# Patient Record
Sex: Male | Born: 1955 | Race: Black or African American | Hispanic: No | Marital: Married | Smoking: Current every day smoker
Health system: Southern US, Community
[De-identification: ages and names within clinical notes are randomized; demographics above are authoritative.]

## PROBLEM LIST (undated history)

## (undated) DIAGNOSIS — I10 Essential (primary) hypertension: Secondary | ICD-10-CM

## (undated) DIAGNOSIS — N1832 Chronic kidney disease, stage 3b: Secondary | ICD-10-CM

## (undated) DIAGNOSIS — N139 Obstructive and reflux uropathy, unspecified: Secondary | ICD-10-CM

## (undated) DIAGNOSIS — E119 Type 2 diabetes mellitus without complications: Secondary | ICD-10-CM

## (undated) HISTORY — DX: Obstructive and reflux uropathy, unspecified: N13.9

## (undated) HISTORY — DX: Type 2 diabetes mellitus without complications: E11.9

## (undated) HISTORY — DX: Chronic kidney disease, stage 3b: N18.32

---

## 2001-05-07 ENCOUNTER — Emergency Department (HOSPITAL_COMMUNITY): Admission: EM | Admit: 2001-05-07 | Discharge: 2001-05-08 | Payer: Self-pay | Admitting: Emergency Medicine

## 2001-12-03 ENCOUNTER — Emergency Department (HOSPITAL_COMMUNITY): Admission: EM | Admit: 2001-12-03 | Discharge: 2001-12-04 | Payer: Self-pay | Admitting: Emergency Medicine

## 2001-12-04 ENCOUNTER — Encounter: Payer: Self-pay | Admitting: Emergency Medicine

## 2002-06-07 ENCOUNTER — Encounter: Payer: Self-pay | Admitting: Emergency Medicine

## 2002-06-07 ENCOUNTER — Emergency Department (HOSPITAL_COMMUNITY): Admission: EM | Admit: 2002-06-07 | Discharge: 2002-06-07 | Payer: Self-pay | Admitting: Emergency Medicine

## 2010-04-01 ENCOUNTER — Emergency Department (HOSPITAL_COMMUNITY): Admission: EM | Admit: 2010-04-01 | Discharge: 2010-04-01 | Payer: Self-pay | Admitting: Emergency Medicine

## 2010-09-14 LAB — URINALYSIS, ROUTINE W REFLEX MICROSCOPIC
Bilirubin Urine: NEGATIVE
Glucose, UA: NEGATIVE mg/dL
Hgb urine dipstick: NEGATIVE
Ketones, ur: NEGATIVE mg/dL
Nitrite: NEGATIVE
Protein, ur: NEGATIVE mg/dL
Specific Gravity, Urine: 1.006 (ref 1.005–1.030)
Urobilinogen, UA: 0.2 mg/dL (ref 0.0–1.0)
pH: 6 (ref 5.0–8.0)

## 2010-09-14 LAB — COMPREHENSIVE METABOLIC PANEL
BUN: 9 mg/dL (ref 6–23)
CO2: 31 mEq/L (ref 19–32)
Calcium: 9.1 mg/dL (ref 8.4–10.5)
Creatinine, Ser: 1.41 mg/dL (ref 0.4–1.5)
GFR calc non Af Amer: 52 mL/min — ABNORMAL LOW (ref >60)
Glucose, Bld: 81 mg/dL (ref 70–99)
Sodium: 141 mEq/L (ref 135–145)
Total Protein: 7.7 g/dL (ref 6.0–8.3)

## 2010-09-14 LAB — DIFFERENTIAL
Lymphocytes Relative: 26 % (ref 12–46)
Lymphs Abs: 1 10*3/uL (ref 0.7–4.0)
Monocytes Relative: 8 % (ref 3–12)
Neutro Abs: 2.5 10*3/uL (ref 1.7–7.7)
Neutrophils Relative %: 64 % (ref 43–77)

## 2010-09-14 LAB — CBC
HCT: 36.3 % — ABNORMAL LOW (ref 39.0–52.0)
Hemoglobin: 12.5 g/dL — ABNORMAL LOW (ref 13.0–17.0)
MCH: 33.1 pg (ref 26.0–34.0)
MCHC: 34.5 g/dL (ref 30.0–36.0)
MCV: 95.8 fL (ref 78.0–100.0)
Platelets: 288 10*3/uL (ref 150–400)
RBC: 3.79 MIL/uL — ABNORMAL LOW (ref 4.22–5.81)
RDW: 13.8 % (ref 11.5–15.5)
WBC: 3.9 10*3/uL — ABNORMAL LOW (ref 4.0–10.5)

## 2011-09-09 IMAGING — CR DG LUMBAR SPINE COMPLETE 4+V
5 series · 5 of 5 positions shown · non-contrast
Comparison: None.

CLINICAL DATA: Right-sided low back and leg pain.

LUMBAR SPINE - COMPLETE 4+ VIEW

[t l-spine a.p.]
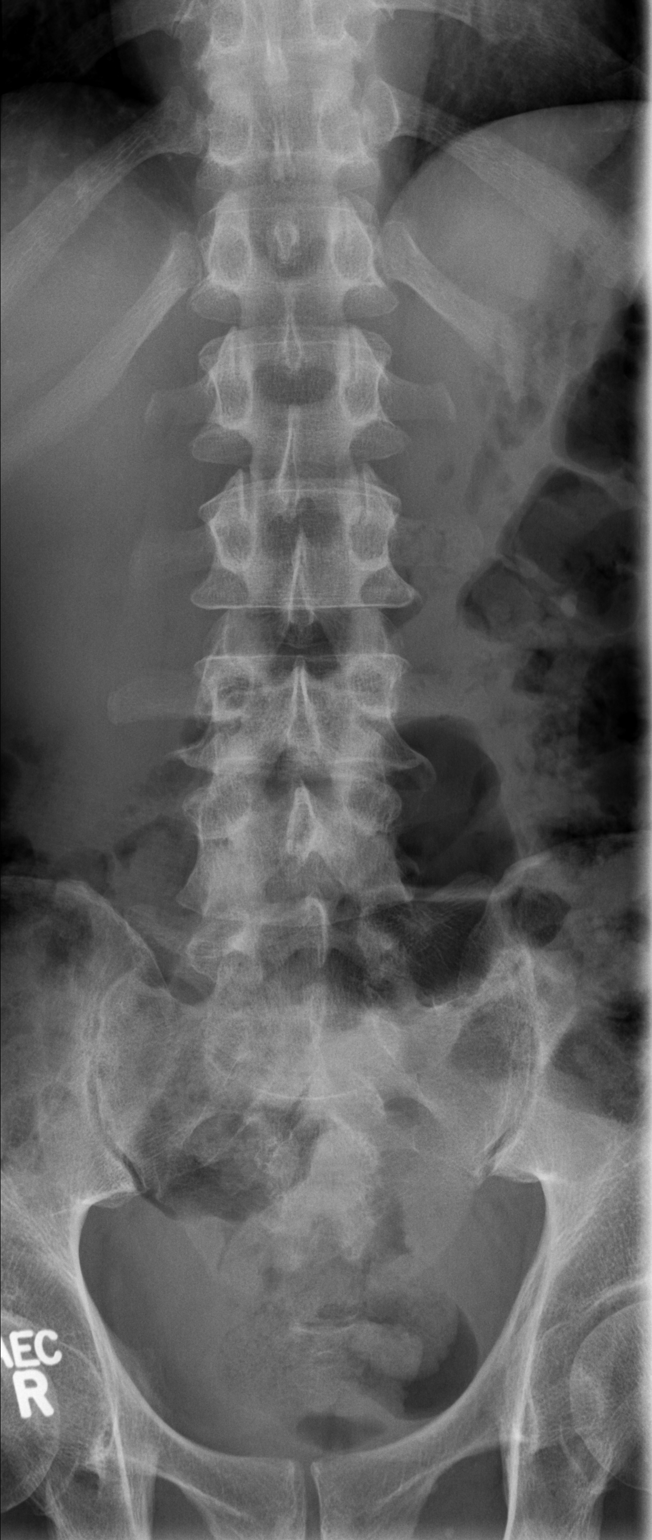

[t l-spine oblique exposure (1 of 2)]
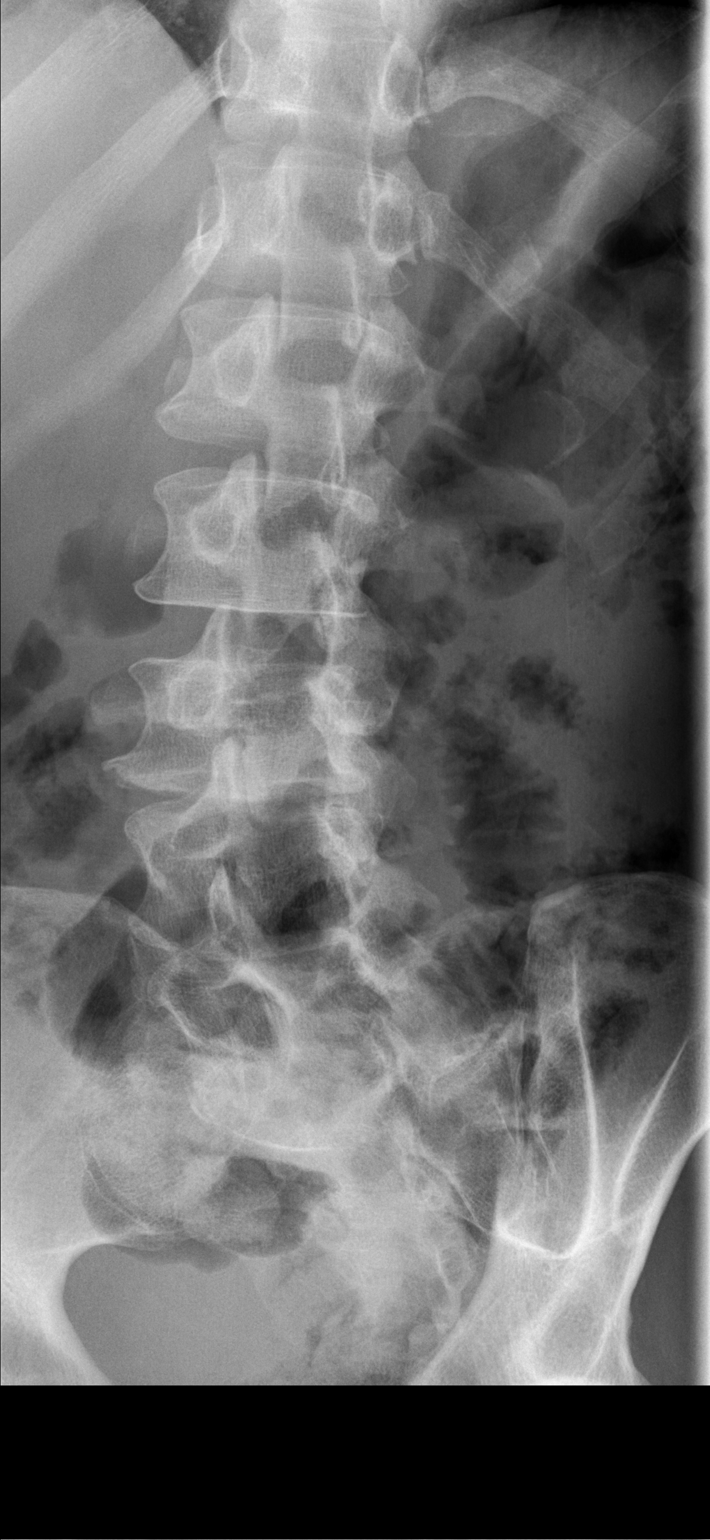

[t l-spine oblique exposure (2 of 2)]
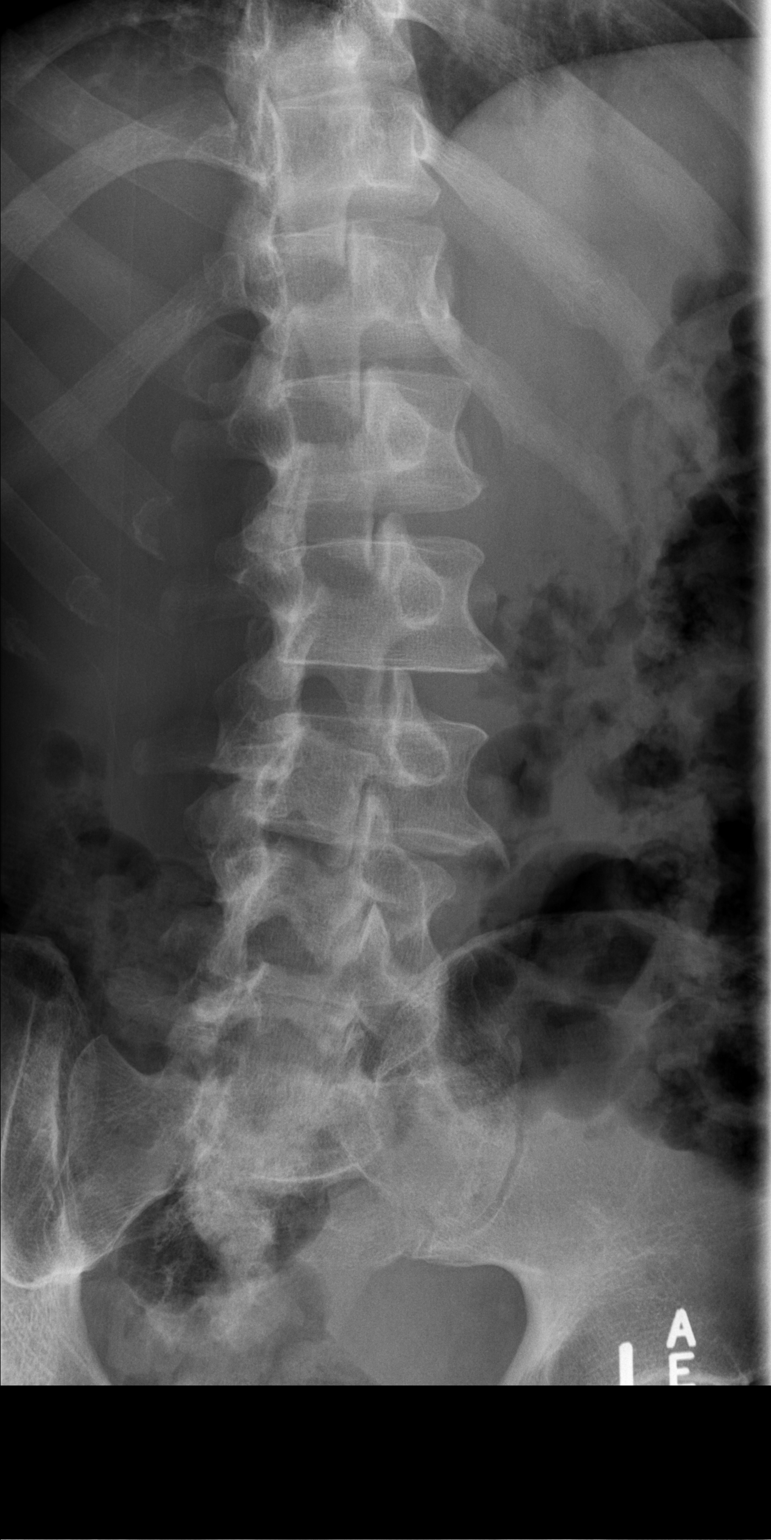

[t l-spine lat]
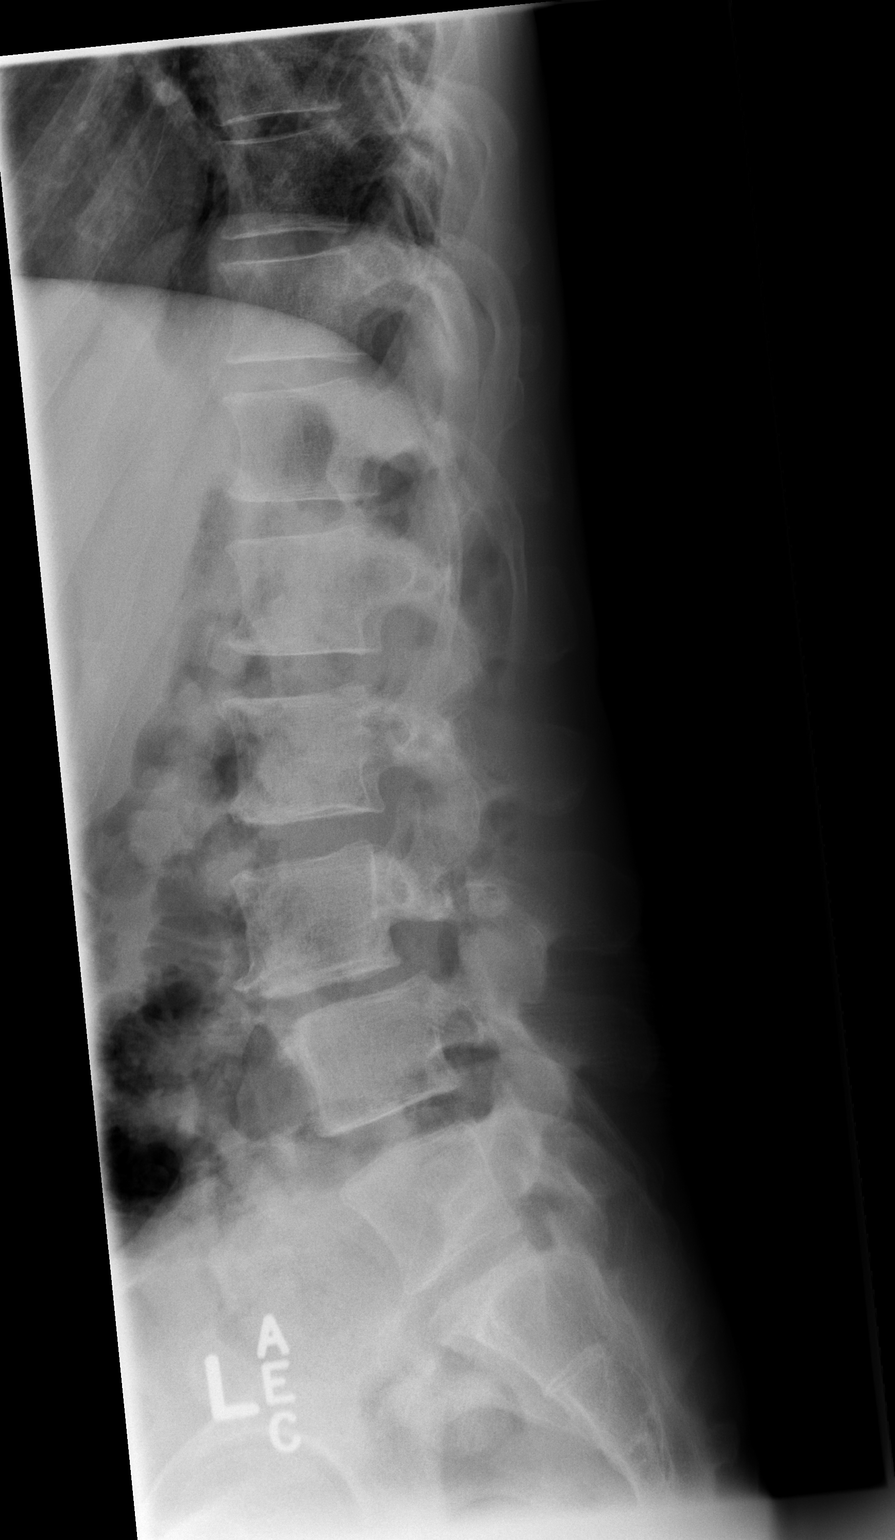

[t l-spine l5-s1 spot]
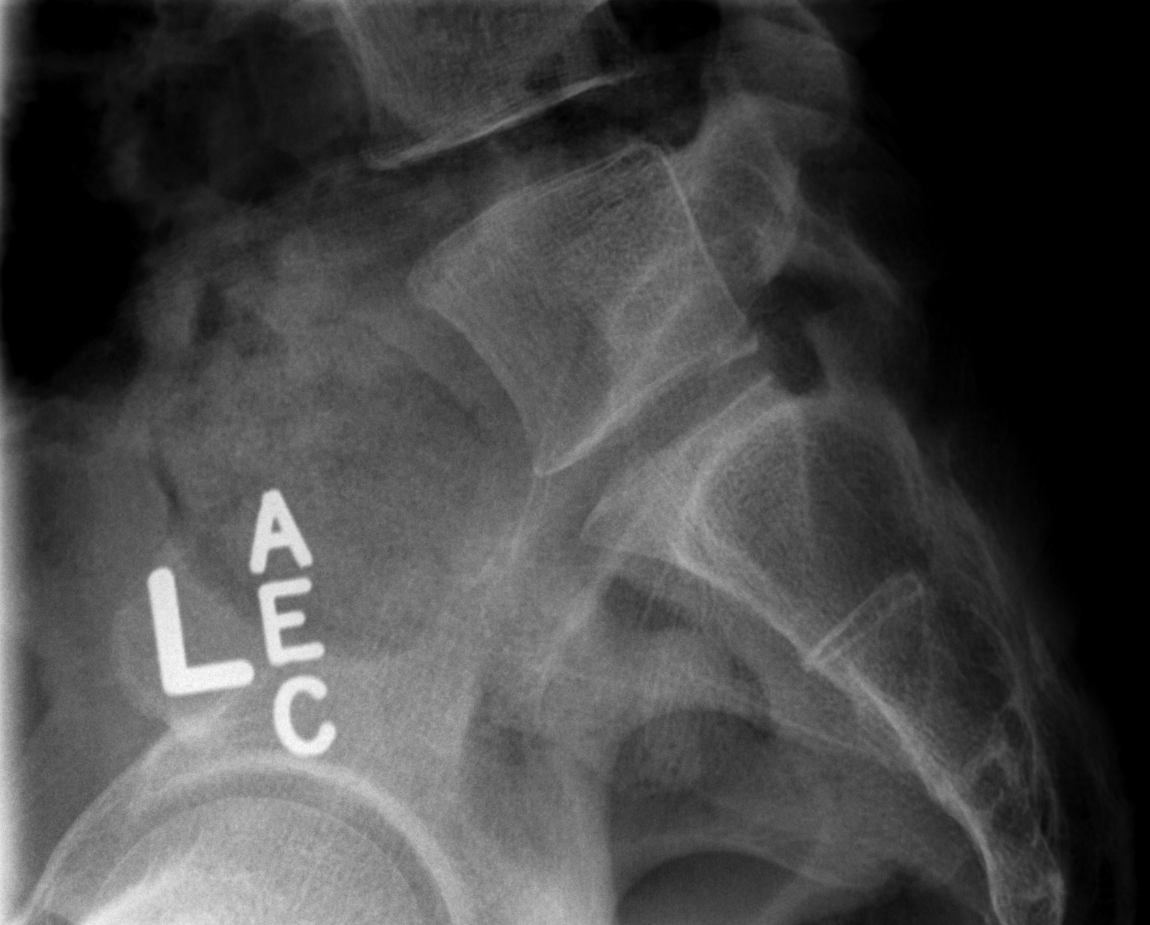

[5 of 5 positions shown; findings below may reference images not displayed]

FINDINGS: No evidence of acute lumbar spine fracture.

Bilateral spondylolysis seen at L3, with grade 1 anterolisthesis at
L3-4 measuring 8 mm.  Moderate L3-4 degenerative disc disease also
noted.  Other intervertebral disc spaces are maintained.
IMPRESSION: 1.  Bilateral L3 spondylolysis, with grade 1 anterolisthesis
measuring 8 mm.
2.  Moderate L3-4 degenerative disc disease.

## 2014-09-08 ENCOUNTER — Encounter (HOSPITAL_COMMUNITY): Payer: Self-pay | Admitting: Emergency Medicine

## 2014-09-08 ENCOUNTER — Emergency Department (INDEPENDENT_AMBULATORY_CARE_PROVIDER_SITE_OTHER)
Admission: EM | Admit: 2014-09-08 | Discharge: 2014-09-08 | Disposition: A | Discharge status: Home / Self Care | Payer: No Typology Code available for payment source | Source: Home / Self Care | Attending: Family Medicine | Admitting: Family Medicine

## 2014-09-08 DIAGNOSIS — S0083XA Contusion of other part of head, initial encounter: Secondary | ICD-10-CM

## 2014-09-08 DIAGNOSIS — S0993XA Unspecified injury of face, initial encounter: Secondary | ICD-10-CM

## 2014-09-08 DIAGNOSIS — H1132 Conjunctival hemorrhage, left eye: Secondary | ICD-10-CM

## 2014-09-08 MED ORDER — HYDROCODONE-ACETAMINOPHEN 5-325 MG PO TABS: 2.0000 | ORAL_TABLET | ORAL | Status: DC | PRN

## 2014-09-08 MED ORDER — AMOXICILLIN 500 MG PO CAPS: 500.0000 mg | ORAL_CAPSULE | Freq: Three times a day (TID) | ORAL | Status: DC

## 2014-09-08 NOTE — Discharge Instructions (Signed)
Contusion A contusion is a deep bruise. Contusions are the result of an injury that caused bleeding under the skin. The contusion may turn blue, purple, or yellow. Minor injuries will give you a painless contusion, but more severe contusions may stay painful and swollen for a few weeks.  CAUSES  A contusion is usually caused by a blow, trauma, or direct force to an area of the body. SYMPTOMS   Swelling and redness of the injured area.  Bruising of the injured area.  Tenderness and soreness of the injured area.  Pain. DIAGNOSIS  The diagnosis can be made by taking a history and physical exam. An X-ray, CT scan, or MRI may be needed to determine if there were any associated injuries, such as fractures. TREATMENT  Specific treatment will depend on what area of the body was injured. In general, the best treatment for a contusion is resting, icing, elevating, and applying cold compresses to the injured area. Over-the-counter medicines may also be recommended for pain control. Ask your caregiver what the best treatment is for your contusion. HOME CARE INSTRUCTIONS   Put ice on the injured area.  Put ice in a plastic bag.  Place a towel between your skin and the bag.  Leave the ice on for 15-20 minutes, 3-4 times a day, or as directed by your health care provider.  Only take over-the-counter or prescription medicines for pain, discomfort, or fever as directed by your caregiver. Your caregiver may recommend avoiding anti-inflammatory medicines (aspirin, ibuprofen, and naproxen) for 48 hours because these medicines may increase bruising.  Rest the injured area.  If possible, elevate the injured area to reduce swelling. SEEK IMMEDIATE MEDICAL CARE IF:   You have increased bruising or swelling.  You have pain that is getting worse.  Your swelling or pain is not relieved with medicines. MAKE SURE YOU:   Understand these instructions.  Will watch your condition.  Will get help right  away if you are not doing well or get worse. Document Released: 03/28/2005 Document Revised: 06/23/2013 Document Reviewed: 04/23/2011 [facility] Patient Information 2015 [place]Care, [place]. This information is not intended to replace advice given to you by your health care provider. Make sure you discuss any questions you have with your health care provider. Dental Injury Your exam shows that you have injured your teeth. The treatment of broken teeth and other dental injuries depends on how badly they are hurt. All dental injuries should be checked as soon as possible by a dentist if there are:  Loose teeth which may need to be wired or bonded with a plastic device to hold them in place.  Broken teeth with exposed tooth pulp which may cause a serious infection.  Painful teeth especially when you bite or chew.  Sharp tooth edges that cut your tongue or lips. Sometimes, antibiotics or pain medicine are prescribed to prevent infection and control pain. Eat a soft or liquid diet and rinse your mouth out after meals with warm water. You should see a dentist or return here at once if you have increased swelling, increased pain or uncontrolled bleeding from the site of your injury. SEEK MEDICAL CARE IF:   You have increased pain not controlled with medicines.  You have swelling around your tooth, in your face or neck.  You have bleeding which starts, continues, or gets worse.  You have a fever. Document Released: 06/18/2005 Document Revised: 09/10/2011 Document Reviewed: 06/17/2009 [facility] Patient Information 2015 [place]Care, [place]LLC. This information is not intended to  replace advice given to you by your health care provider. Make sure you discuss any questions you have with your health care provider.  Emergency Department Resource Guide 1) Find a Doctor and Pay Out of Pocket Although you won't have to find out who is covered by your insurance plan, it is a good idea to ask around and get  recommendations. You will then need to call the office and see if the doctor you have chosen will accept you as a new patient and what types of options they offer for patients who are self-pay. Some doctors offer discounts or will set up payment plans for their patients who do not have insurance, but you will need to ask so you aren't surprised when you get to your appointment.  2) Contact Your Local Health Department Not all health departments have doctors that can see patients for sick visits, but many do, so it is worth a call to see if yours does. If you don't know where your local health department is, you can check in your phone book. The CDC also has a tool to help you locate your state's health department, and many state websites also have listings of all of their local health departments.  3) Find a Walk-in Clinic If your illness is not likely to be very severe or complicated, you may want to try a walk in clinic. These are popping up all over the country in pharmacies, drugstores, and shopping centers. They're usually staffed by nurse practitioners or physician assistants that have been trained to treat common illnesses and complaints. They're usually fairly quick and inexpensive. However, if you have serious medical issues or chronic medical problems, these are probably not your best option.  No Primary Care Doctor: - Call Health Connect at  [phone] - they can help you locate a primary care doctor that  accepts your insurance, provides certain services, etc. - Physician Referral Service- [phone]  Chronic Pain Problems: Organization         Address  Phone   Notes  [facility]  [phone] Patients need to be referred by their primary care doctor.   Medication Assistance: Organization         Address  Phone   Notes  [facility] Medication [facility] [address] [place], [place] [address] [phone] --Must be a resident of  [facility] -- Must have NO insurance coverage whatsoever (no Medicaid/ Medicare, etc.) -- The pt. MUST have a primary care doctor that directs their care regularly and follows them in the community   [facility]ist  [phone]   [employer]  [phone]    Agencies that provide inexpensive medical care: Organization         Address  Phone   Notes  [facility]  [phone]   [facility]    [phone]   [facility] [address] [place], [place] [address] [phone]   [facility] [address] [place], [place] [phone]   Planned Parenthood    [phone]   [facility]    [phone]   [facility]  [address], [place] Phone:  [phone], Fax:  [phone] Hours of Operation:  9 am - 6 pm, M-F.  Also accepts Medicaid/Medicare and self-pay.  [facility]  [address], [place] Phone: [phone])  [phone], Fax: [phone]. Hours of Operation:  8:30 am - 5:30 pm, M-F.  Also accepts Medicaid and self-pay.  [facility] [place] [address], [place] Phone: [phone]) [phone]   [facility] [address], [place], [place] [phone], Ext. [phone] Mondays & Thursdays: 7-9 AM.  First 15 patients are seen on a first come, first serve basis.    Medicaid-accepting [facility] Providers:  Organization         Address  Phone   Notes  [facility] [address], [place] [phone] Also accepts self-pay patients.  [facility] [address], [place]  [phone]   [facility] [address], [place] [phone]   [facility] [address] [place] [address], [place] [phone]) [phone]   Renaye RakersVeita Bland [address], [place]   [phone] Only accepts [place] Access  [place]icaid patients after they have their name applied to their card.   Self-Pay (no insurance) in [facility]:  Organization         Address  Phone   Notes  Sickle Cell Patients, [facility] [address], [place] [phone]) [phone]   [facility] [address], [place] [phone]   [facility] [place]  [address], [place] [phone]   [facility]/Dr. Osei-Bonsu  [address] [place] [address], [place] or [phone], [place] [phone] Phone number for both [place] and [place] locations is the same.  [facility] [address], [place] [phone]   [facility]  [address] [place] [address], [place] or [address] [phone]) [phone]   [facility] [address], [place] [phone]) [phone], phone; [phone], fax Sees patients 1st and 3rd Saturday of every month.  Must not qualify for public or private insurance (i.e. Medicaid, Medicare, [place] Health Choice, Veterans' Benefits)  Household income should be no more than 200% of the poverty level The clinic cannot treat you if you are pregnant or think you are pregnant  Sexually transmitted diseases are not treated at the clinic.    Dental Care: Organization         Address  Phone  Notes  [facility] Department of [facility] [address], [place] [phone] Accepts children up to age 61 who are enrolled in [place]icaid or [place] Health Choice; pregnant women with a Medicaid card; and children who have applied for Medicaid or [place] Health Choice, but were declined, whose parents can pay a reduced fee at time of service.  [facility] of [facility] [place]  [address], [place] [phone]) [phone] Accepts children up to age 61 who are enrolled in [place]icaid or [place] Health Choice; pregnant women with a Medicaid  card; and children who have applied for Medicaid or [place] Health Choice, but were declined, whose parents can pay a reduced fee at time of service.  [facility]  [address], [place] [phone] Patients are seen by appointment only. Walk-ins are not accepted. [facility] will see patients 61 years of age and older. Monday - Tuesday (8am-5pm) Most Wednesdays (8:30-5pm) $30 per visit, cash only  [facility]  [address], [place] [phone] Patients are seen by appointment only. Walk-ins are not accepted. [facility] will see patients 85 years of age and older. One Wednesday Evening (Monthly: Volunteer Based).  $30 per visit, cash only  [employer]  [phone] for adults; Children under age 62, call Graduate Pediatric Dentistry at [phone]. Children aged 57-14, please call [phone] to request a pediatric application.  Dental services are provided in all areas of dental care including fillings, crowns and bridges, complete and partial dentures, implants, gum treatment, root canals, and extractions. Preventive care is also provided. Treatment is provided to both adults and children. Patients are selected via a lottery and there is often a waiting list.   [facility] [address], [place]  [phone] [email or web address]   [facility] [address] [place], [place] [phone], Ext. [phone] Second and Fourth Thursday of each month, opens at 6:30 AM; Clinic ends at 9 AM.  Patients are seen on a first-come first-served basis, and a limited number are seen during each clinic.   [facility]  [address] [place], [place] [phone]   Eligibility Requirements You must have lived in [place], [place], or [place] counties for at least the last three months.   You cannot be eligible for state or federal sponsored [place],  including [employer], [place], or [employer].   You generally cannot be eligible for healthcare insurance through your employer.    How to apply: Eligibility screenings are held every Tuesday and Wednesday afternoon from 1:00 pm until 4:00 pm. You do not need an appointment for the interview!  [facility] [address] [place], [place] [phone]   [facility] Health Department  [phone]   [facility] Health Department  [phone]   [facility] Health Department  [phone]    Behavioral Health Resources in the Community: Intensive Outpatient Programs Organization         Address  Phone  Notes  [facility] [address], [place], [place] [phone]   [facility] Outpatient [address], [place], [place] [phone]   [facility] [address] [place], [place]  [phone]   [facility] [address]  [place], [place] [phone] or [phone]   Substance Abuse Resources Organization         Address  Phone  Notes  [facility]  [phone]   [facility]  [phone]   [facility] [place]  [phone]   Floydene Flock  [phone]   Residential & Outpatient Substance Abuse Program  [phone]   Psychological Services Organization         Address  Phone  Notes  [facility]  [phone]   [facility] Services  [phone]   [facility] [address] [place] [phone] or [phone]    Mobile Crisis Teams Organization         Address  Phone  Notes  [facility], Mobile Crisis Care Unit  [phone]   [facility]  [address] [place], [place] [phone]   Doristine Locks [address] [place] [phone]    Self-Help/Support Groups Organization         Address  Phone             Notes  Mental Health Assoc.  of [place] - variety of support groups  [phone]- [phone] Call for more information  Narcotics Anonymous (NA), [facility] [address], [employer] [place]  2 meetings at this location   Residential Sports administrator         Address  Phone  Notes  [facility] [address],    [place]  [phone]   [facility]  [address] [place] [address], [place], [place] [phone]   [facility] [address] [place], [place] [phone] Admissions: 8am-3pm M-F  [facility] [address]    [place], [place] [phone]   [facility] [address] [place], [place], [place] [phone]   [facility] [address]  [place], [place] [phone]   [facility] - Intensive Outpatient [address], [place], [place] [phone]   [facility] ([facility]iction Recovery [facility] [address] [place]  [place], [place] [phone] or [phone]   [facility] ([facility]) [address] [place], [place] [phone] Accepts Medicaid  [facility] [address]  [place] [phone] Substance Abuse/Addiction Treatment   [facility] Organization         Address  Phone  Notes  [facility]  [phone]   Angie Fava, PhD [address] [place], [place]   [phone] or [phone]   [facility]   [address] [place], [place] [phone]   [facility] [address] [place] [address] [place], [place] [phone] Insurance/Medicaid/sponsorship through [facility] and Families [address]                                    [place], [place] [phone] Therapy/tele-psych/case  [facility] [address], [place] [phone]    Dr. Lolly Mustache  [phone]   [facility]  [employer] [facility] Dept. 1) [address], [place] 2)  [address] [place] 3)  [address], [place] [phone]  [phone]   [facility] Child Abuse Hotline (614)696-9098 or [phone] (After Hours)
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
## 2020-08-04 ENCOUNTER — Telehealth: Payer: Self-pay

## 2020-08-04 NOTE — Telephone Encounter (Signed)
NOTES ON FILE FROM [facility] [phone], SENT REFERRAL TO SCHEDULING 

## 2020-08-08 ENCOUNTER — Telehealth: Payer: Self-pay

## 2020-08-08 NOTE — Telephone Encounter (Signed)
NOTES ON FILE FROM [facility] [phone], SENT REFERRAL TO SCHEDULING 

## 2020-08-30 ENCOUNTER — Encounter: Payer: Self-pay | Admitting: General Practice

## 2021-12-29 ENCOUNTER — Emergency Department (HOSPITAL_COMMUNITY): Payer: Medicare Other

## 2021-12-29 ENCOUNTER — Emergency Department (HOSPITAL_COMMUNITY)
Admission: EM | Admit: 2021-12-29 | Discharge: 2021-12-29 | Disposition: A | Discharge status: Home / Self Care | Payer: Medicare Other | Attending: Student | Admitting: Student

## 2021-12-29 ENCOUNTER — Encounter (HOSPITAL_COMMUNITY): Payer: Self-pay

## 2021-12-29 DIAGNOSIS — I10 Essential (primary) hypertension: Secondary | ICD-10-CM | POA: Insufficient documentation

## 2021-12-29 DIAGNOSIS — R0789 Other chest pain: Secondary | ICD-10-CM | POA: Diagnosis not present

## 2021-12-29 DIAGNOSIS — R079 Chest pain, unspecified: Secondary | ICD-10-CM | POA: Diagnosis present

## 2021-12-29 DIAGNOSIS — F17210 Nicotine dependence, cigarettes, uncomplicated: Secondary | ICD-10-CM | POA: Insufficient documentation

## 2021-12-29 DIAGNOSIS — F1490 Cocaine use, unspecified, uncomplicated: Secondary | ICD-10-CM | POA: Diagnosis not present

## 2021-12-29 DIAGNOSIS — Z79899 Other long term (current) drug therapy: Secondary | ICD-10-CM | POA: Diagnosis not present

## 2021-12-29 LAB — TROPONIN I (HIGH SENSITIVITY)
Troponin I (High Sensitivity): 7 ng/L (ref <18)
Troponin I (High Sensitivity): 7 ng/L (ref <18)

## 2021-12-29 LAB — BASIC METABOLIC PANEL
Anion gap: 12 (ref 5–15)
BUN: 21 mg/dL (ref 8–23)
CO2: 23 mmol/L (ref 22–32)
Calcium: 9.2 mg/dL (ref 8.9–10.3)
Chloride: 99 mmol/L (ref 98–111)
Creatinine, Ser: 1.95 mg/dL — ABNORMAL HIGH (ref 0.61–1.24)
GFR, Estimated: 37 mL/min — ABNORMAL LOW (ref >60)
Glucose, Bld: 106 mg/dL — ABNORMAL HIGH (ref 70–99)
Potassium: 3.5 mmol/L (ref 3.5–5.1)
Sodium: 134 mmol/L — ABNORMAL LOW (ref 135–145)

## 2021-12-29 LAB — CBC
HCT: 37.4 % — ABNORMAL LOW (ref 39.0–52.0)
Hemoglobin: 12.5 g/dL — ABNORMAL LOW (ref 13.0–17.0)
MCH: 32.6 pg (ref 26.0–34.0)
MCHC: 33.4 g/dL (ref 30.0–36.0)
MCV: 97.4 fL (ref 80.0–100.0)
Platelets: 257 10*3/uL (ref 150–400)
RBC: 3.84 MIL/uL — ABNORMAL LOW (ref 4.22–5.81)
RDW: 12.6 % (ref 11.5–15.5)
WBC: 5.5 10*3/uL (ref 4.0–10.5)
nRBC: 0 % (ref 0.0–0.2)

## 2021-12-29 LAB — RAPID URINE DRUG SCREEN, HOSP PERFORMED
Amphetamines: NOT DETECTED
Barbiturates: NOT DETECTED
Benzodiazepines: NOT DETECTED
Cocaine: POSITIVE — AB
Opiates: NOT DETECTED
Tetrahydrocannabinol: NOT DETECTED

## 2021-12-29 LAB — ETHANOL: Alcohol, Ethyl (B): 44 mg/dL — ABNORMAL HIGH (ref <10)

## 2021-12-29 LAB — CBG MONITORING, ED: Glucose-Capillary: 73 mg/dL (ref 70–99)

## 2021-12-29 NOTE — ED Provider Triage Note (Signed)
Emergency Medicine Provider Triage Evaluation Note  William Bradshaw , a 66 y.o. male  was evaluated in triage.  Pt complains of chest pain came on while he was sitting down, pain is in his epigastric region does not radiate, no shortness of breath lightheaded dizziness become diaphoretic, states that happened after he did some cocaine, he also notes that he drinks some alcohol, no history of cardiac abnormalities history of PEs or DVTs currently on hormone therapy there is history of hypertension but no hyperlipidemia nondiabetic does smoke cigarettes.  Currently has no chest pain at this time..  Review of Systems  Positive: Epigastric tenderness, chest pain Negative: Shortness of breath diaphoretic  Physical Exam  BP (!) 170/106 (BP Location: Left Arm)   Pulse 81   Temp 98.5 F (36.9 C) (Oral)   Resp 15   SpO2 97%  Gen:   Awake, no distress   Resp:  Normal effort  MSK:   Moves extremities without difficulty  Other:    Medical Decision Making  Medically screening exam initiated at 2:04 AM.  Appropriate orders placed.  William Bradshaw was informed that the remainder of the evaluation will be completed by another provider, this initial triage assessment does not replace that evaluation, and the importance of remaining in the ED until their evaluation is complete.  Presents with epigastric/chest pain lab or imaging have been ordered will need further work-up.   Carroll Sage, PA-C 12/29/21 0206
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
## 2021-12-29 NOTE — ED Notes (Signed)
PT provided with refreshments

## 2021-12-29 NOTE — ED Provider Triage Note (Deleted)
Emergency Medicine Provider Triage Evaluation Note  Jakyrie E Goethe , a 66 y.o. male  was evaluated in triage.  Pt complains of shortness of breath and intoxication.  Patient states that he has been feeling short of breath for last few days, states that the surgery breath is intermittent, currently does not feel short of breath right now no associated chest pain does not become diaphoretic no lightheaded or dizziness, he states that he drank 4-5 beers today, denies any illicit drug use.  Review of Systems  Positive: Shortness of breath, intoxication Negative: Chest pain, abdominal pain  Physical Exam  BP (!) 170/106 (BP Location: Left Arm)   Pulse 81   Temp 98.5 F (36.9 C) (Oral)   Resp 15   SpO2 97%  Gen:   Awake, no distress   Resp:  Normal effort  MSK:   Moves extremities without difficulty  Other:    Medical Decision Making  Medically screening exam initiated at 1:52 AM.  Appropriate orders placed.  Kavir E Prisk was informed that the remainder of the evaluation will be completed by another provider, this initial triage assessment does not replace that evaluation, and the importance of remaining in the ED until their evaluation is complete.  Shortness of breath, lab work imaging been ordered will need further work-up.   Carroll Sage, PA-C 12/29/21 [phone]

## 2021-12-29 NOTE — Discharge Instructions (Signed)
Your work-up here was reassuring, please discontinue cocaine use as I suspect this is likely cause of your pain, I also recommend discontinuing alcohol use this is also contributed to your pain.  It is noted that your kidney functions are slightly elevated, please do not take any NSAIDs, do not use cocaine, and stay hydrated, please follow-up with your primary care doctor for reassessment.  Come back to the emergency department if you develop chest pain, shortness of breath, severe abdominal pain, uncontrolled nausea, vomiting, diarrhea.
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
## 2021-12-29 NOTE — ED Provider Notes (Signed)
[facility] EMERGENCY DEPARTMENT Provider Note   CSN: [id number] Arrival date & time: 12/29/21  0139     History  Chief Complaint  Patient presents with   Chest Pain    William Bradshaw is a 66 y.o. male.  HPI  Presents with chest pain came on while he was sitting down, pain is in his epigastric region does not radiate, no shortness of breath lightheaded dizziness become diaphoretic, states that happened after he did some cocaine, he also notes that he drinks some alcohol, no history of cardiac abnormalities history of PEs or DVTs currently on hormone therapy there is history of hypertension but no hyperlipidemia nondiabetic does smoke cigarettes.  Currently has no chest pain at this time..  Home Medications Prior to Admission medications   Medication Sig Start Date End Date Taking? Authorizing Provider  amoxicillin (AMOXIL) 500 MG capsule Take 1 capsule (500 mg total) by mouth 3 (three) times daily. 09/08/14   Elson Areas, PA-C  HYDROcodone-acetaminophen (NORCO/VICODIN) 5-325 MG per tablet Take 2 tablets by mouth every 4 (four) hours as needed. 09/08/14   Elson Areas, PA-C      Allergies    Patient has no known allergies.    Review of Systems   Review of Systems  Constitutional:  Negative for chills and fever.  Respiratory:  Negative for shortness of breath.   Cardiovascular:  Negative for chest pain.  Gastrointestinal:  Negative for abdominal pain.  Neurological:  Negative for headaches.    Physical Exam Updated Vital Signs BP (!) 187/125   Pulse 73   Temp 98.5 F (36.9 C) (Oral)   Resp 18   SpO2 100%  Physical Exam Vitals and nursing note reviewed.  Constitutional:      General: He is not in acute distress.    Appearance: He is not ill-appearing.  HENT:     Head: Normocephalic and atraumatic.     Nose: No congestion.  Eyes:     Conjunctiva/sclera: Conjunctivae normal.  Cardiovascular:     Rate and Rhythm: Normal rate and regular  rhythm.     Pulses: Normal pulses.     Heart sounds: No murmur heard.    No friction rub. No gallop.  Pulmonary:     Effort: No respiratory distress.     Breath sounds: No wheezing, rhonchi or rales.  Skin:    General: Skin is warm and dry.  Neurological:     Mental Status: He is alert.  Psychiatric:        Mood and Affect: Mood normal.     ED Results / Procedures / Treatments   Labs (all labs ordered are listed, but only abnormal results are displayed) Labs Reviewed  BASIC METABOLIC PANEL - Abnormal; Notable for the following components:      Result Value   Sodium 134 (*)    Glucose, Bld 106 (*)    Creatinine, Ser 1.95 (*)    GFR, Estimated 37 (*)    All other components within normal limits  CBC - Abnormal; Notable for the following components:   RBC 3.84 (*)    Hemoglobin 12.5 (*)    HCT 37.4 (*)    All other components within normal limits  ETHANOL - Abnormal; Notable for the following components:   Alcohol, Ethyl (B) 44 (*)    All other components within normal limits  RAPID URINE DRUG SCREEN, HOSP PERFORMED - Abnormal; Notable for the following components:   Cocaine POSITIVE (*)  All other components within normal limits  CBG MONITORING, ED  TROPONIN I (HIGH SENSITIVITY)  TROPONIN I (HIGH SENSITIVITY)    EKG None  Radiology DG Chest 2 View  Result Date: 12/29/2021 CLINICAL DATA:  Chest pain EXAM: CHEST - 2 VIEW COMPARISON:  None Available. FINDINGS: The heart size and mediastinal contours are within normal limits. Both lungs are clear. The visualized skeletal structures are unremarkable. IMPRESSION: No active cardiopulmonary disease. Electronically Signed   By: Helyn Numbers M.D.   On: 12/29/2021 02:39    Procedures Procedures    Medications Ordered in ED Medications - No data to display  ED Course/ Medical Decision Making/ A&P                           Medical Decision Making Amount and/or Complexity of Data Reviewed Labs: ordered. Radiology:  ordered.   This patient presents to the ED for concern of chest pain, this involves an extensive number of treatment options, and is a complaint that carries with it a high risk of complications and morbidity.  The differential diagnosis includes PE, ACS, pneumonia    Additional history obtained:  Additional history obtained from N/A External records from outside source obtained and reviewed including previous ED visits   Co morbidities that complicate the patient evaluation  Hypertension, polysubstance dependency  Social Determinants of Health:  N/a      Lab Tests:  I Ordered, and personally interpreted labs.  The pertinent results include: CBC shows no sick anemia hemoglobin 12.5, BMP shows sodium of 134 glucose 106 creatinine 1.95 GFR 37, rapid urine drug screen positive for cocaine, ethanol 44, negative delta troponin   Imaging Studies ordered:  I ordered imaging studies including chest x-ray I independently visualized and interpreted imaging which showed unremarkable I agree with the radiologist interpretation   Cardiac Monitoring:  The patient was maintained on a cardiac monitor.  I personally viewed and interpreted the cardiac monitored which showed an underlying rhythm of: EKG without signs of ischemia   Medicines ordered and prescription drug management:  I ordered medication including N/A I have reviewed the patients home medicines and have made adjustments as needed  Critical Interventions:  N/A    Reevaluation:  Presents with chest pain triage obtain lab work imaging which I personally reviewed does show creatinine 1.9 appears to be his baseline, chest x-ray is unremarkable, he is having no complaints, he is agreement discharge at this time.  Consultations Obtained:  N/A    Test Considered:  N/A     Rule out I have low suspicion for ACS as history is atypical, patient has no cardiac history, EKG was sinus rhythm without signs of  ischemia, patient had negative delta troponin.  Low suspicion for PE as patient denies pleuritic chest pain, shortness of breath, patient denies leg pain, no pedal edema noted on exam, patient was PERC negative.  Low suspicion for AAA or aortic dissection as history is atypical, patient has low risk factors.  Low suspicion for systemic infection as patient is nontoxic-appearing, vital signs reassuring, no obvious source infection noted on exam.     Dispostion and problem list  After consideration of the diagnostic results and the patients response to treatment, I feel that the patent would benefit from discharge.  Chest pain since resolved-suspect this is more GERD versus polysubstance use i.e. cocaine recommend that he discontinue cocaine use follow-up PCP for further evaluation Decrease GFR-inform patient kidney function, recommend  that he discontinued NSAID use, cocaine use, stay hydrated, follow-up with PCP for reassessment.            Final Clinical Impression(s) / ED Diagnoses Final diagnoses:  Atypical chest pain  Cocaine use    Rx / DC Orders ED Discharge Orders     None         Carroll Sage, PA-C 12/29/21 0536    Sabas Sous, MD 12/29/21 [phone]

## 2023-04-02 ENCOUNTER — Encounter: Payer: Self-pay | Admitting: Nurse Practitioner

## 2023-04-03 ENCOUNTER — Other Ambulatory Visit: Payer: Self-pay | Admitting: Nurse Practitioner

## 2023-04-03 DIAGNOSIS — I10 Essential (primary) hypertension: Secondary | ICD-10-CM

## 2023-04-11 ENCOUNTER — Ambulatory Visit
Admission: RE | Admit: 2023-04-11 | Discharge: 2023-04-11 | Disposition: A | Discharge status: Home / Self Care | Payer: Medicare HMO | Source: Ambulatory Visit | Attending: Nurse Practitioner | Admitting: Nurse Practitioner

## 2023-04-11 DIAGNOSIS — I10 Essential (primary) hypertension: Secondary | ICD-10-CM

## 2023-04-12 ENCOUNTER — Emergency Department (HOSPITAL_COMMUNITY): Payer: Medicare HMO

## 2023-04-12 ENCOUNTER — Encounter (HOSPITAL_COMMUNITY): Payer: Self-pay | Admitting: Emergency Medicine

## 2023-04-12 ENCOUNTER — Inpatient Hospital Stay (HOSPITAL_COMMUNITY)
Admission: EM | Admit: 2023-04-12 | Discharge: 2023-04-18 | DRG: 689 | Disposition: A | Discharge status: Home / Self Care | Payer: Medicare HMO | Attending: Internal Medicine | Admitting: Internal Medicine

## 2023-04-12 ENCOUNTER — Other Ambulatory Visit: Payer: Self-pay

## 2023-04-12 DIAGNOSIS — R3916 Straining to void: Secondary | ICD-10-CM | POA: Diagnosis present

## 2023-04-12 DIAGNOSIS — F17210 Nicotine dependence, cigarettes, uncomplicated: Secondary | ICD-10-CM | POA: Diagnosis present

## 2023-04-12 DIAGNOSIS — E875 Hyperkalemia: Secondary | ICD-10-CM | POA: Diagnosis present

## 2023-04-12 DIAGNOSIS — N179 Acute kidney failure, unspecified: Secondary | ICD-10-CM | POA: Diagnosis present

## 2023-04-12 DIAGNOSIS — N323 Diverticulum of bladder: Secondary | ICD-10-CM | POA: Diagnosis present

## 2023-04-12 DIAGNOSIS — F1410 Cocaine abuse, uncomplicated: Secondary | ICD-10-CM | POA: Diagnosis present

## 2023-04-12 DIAGNOSIS — I129 Hypertensive chronic kidney disease with stage 1 through stage 4 chronic kidney disease, or unspecified chronic kidney disease: Secondary | ICD-10-CM | POA: Diagnosis present

## 2023-04-12 DIAGNOSIS — D631 Anemia in chronic kidney disease: Secondary | ICD-10-CM | POA: Diagnosis present

## 2023-04-12 DIAGNOSIS — R339 Retention of urine, unspecified: Secondary | ICD-10-CM | POA: Diagnosis present

## 2023-04-12 DIAGNOSIS — G4700 Insomnia, unspecified: Secondary | ICD-10-CM | POA: Diagnosis present

## 2023-04-12 DIAGNOSIS — E43 Unspecified severe protein-calorie malnutrition: Secondary | ICD-10-CM | POA: Diagnosis present

## 2023-04-12 DIAGNOSIS — N3000 Acute cystitis without hematuria: Secondary | ICD-10-CM

## 2023-04-12 DIAGNOSIS — Z79899 Other long term (current) drug therapy: Secondary | ICD-10-CM | POA: Diagnosis not present

## 2023-04-12 DIAGNOSIS — R338 Other retention of urine: Secondary | ICD-10-CM | POA: Diagnosis present

## 2023-04-12 DIAGNOSIS — Z681 Body mass index (BMI) 19 or less, adult: Secondary | ICD-10-CM | POA: Diagnosis not present

## 2023-04-12 DIAGNOSIS — N3289 Other specified disorders of bladder: Secondary | ICD-10-CM | POA: Diagnosis present

## 2023-04-12 DIAGNOSIS — N136 Pyonephrosis: Principal | ICD-10-CM | POA: Diagnosis present

## 2023-04-12 DIAGNOSIS — N401 Enlarged prostate with lower urinary tract symptoms: Secondary | ICD-10-CM | POA: Diagnosis present

## 2023-04-12 DIAGNOSIS — N320 Bladder-neck obstruction: Secondary | ICD-10-CM | POA: Diagnosis present

## 2023-04-12 DIAGNOSIS — N1832 Chronic kidney disease, stage 3b: Secondary | ICD-10-CM | POA: Diagnosis present

## 2023-04-12 HISTORY — DX: Essential (primary) hypertension: I10

## 2023-04-12 LAB — HEPATIC FUNCTION PANEL
ALT: 15 U/L (ref 0–44)
AST: 17 U/L (ref 15–41)
Albumin: 3.2 g/dL — ABNORMAL LOW (ref 3.5–5.0)
Alkaline Phosphatase: 65 U/L (ref 38–126)
Bilirubin, Direct: <0.1 mg/dL (ref 0.0–0.2)
Total Bilirubin: 0.4 mg/dL (ref 0.3–1.2)
Total Protein: 8.2 g/dL — ABNORMAL HIGH (ref 6.5–8.1)

## 2023-04-12 LAB — URINALYSIS, ROUTINE W REFLEX MICROSCOPIC
Bilirubin Urine: NEGATIVE
Glucose, UA: NEGATIVE mg/dL
Ketones, ur: NEGATIVE mg/dL
Nitrite: NEGATIVE
Protein, ur: 100 mg/dL — AB
Specific Gravity, Urine: 1.01 (ref 1.005–1.030)
WBC, UA: >50 WBC/hpf (ref 0–5)
pH: 5 (ref 5.0–8.0)

## 2023-04-12 LAB — CBC
HCT: 31.7 % — ABNORMAL LOW (ref 39.0–52.0)
Hemoglobin: 10.1 g/dL — ABNORMAL LOW (ref 13.0–17.0)
MCH: 31.1 pg (ref 26.0–34.0)
MCHC: 31.9 g/dL (ref 30.0–36.0)
MCV: 97.5 fL (ref 80.0–100.0)
Platelets: 451 10*3/uL — ABNORMAL HIGH (ref 150–400)
RBC: 3.25 MIL/uL — ABNORMAL LOW (ref 4.22–5.81)
RDW: 12.6 % (ref 11.5–15.5)
WBC: 8.6 10*3/uL (ref 4.0–10.5)
nRBC: 0 % (ref 0.0–0.2)

## 2023-04-12 LAB — BASIC METABOLIC PANEL
Anion gap: 13 (ref 5–15)
BUN: 43 mg/dL — ABNORMAL HIGH (ref 8–23)
CO2: 17 mmol/L — ABNORMAL LOW (ref 22–32)
Calcium: 9.2 mg/dL (ref 8.9–10.3)
Chloride: 108 mmol/L (ref 98–111)
Creatinine, Ser: 3.78 mg/dL — ABNORMAL HIGH (ref 0.61–1.24)
GFR, Estimated: 17 mL/min — ABNORMAL LOW (ref >60)
Glucose, Bld: 98 mg/dL (ref 70–99)
Potassium: 5.6 mmol/L — ABNORMAL HIGH (ref 3.5–5.1)
Sodium: 138 mmol/L (ref 135–145)

## 2023-04-12 LAB — RAPID URINE DRUG SCREEN, HOSP PERFORMED
Amphetamines: NOT DETECTED
Barbiturates: NOT DETECTED
Benzodiazepines: NOT DETECTED
Cocaine: POSITIVE — AB
Opiates: NOT DETECTED
Tetrahydrocannabinol: NOT DETECTED

## 2023-04-12 LAB — I-STAT CG4 LACTIC ACID, ED
Lactic Acid, Venous: 1.3 mmol/L (ref 0.5–1.9)
Lactic Acid, Venous: 1.2 mmol/L (ref 0.5–1.9)

## 2023-04-12 MED ORDER — POLYETHYLENE GLYCOL 3350 17 G PO PACK
17.0000 g | PACK | Freq: Every day | ORAL | Status: DC | PRN
  Administered 2023-04-15: 17 g via ORAL
  Filled 2023-04-12: qty 1

## 2023-04-12 MED ORDER — SODIUM CHLORIDE 0.9 % IV SOLN
1.0000 g | Freq: Once | INTRAVENOUS | Status: AC
  Administered 2023-04-12: 1 g via INTRAVENOUS
  Filled 2023-04-12: qty 10

## 2023-04-12 MED ORDER — SODIUM CHLORIDE 0.9 % IV BOLUS
1000.0000 mL | Freq: Once | INTRAVENOUS | Status: AC
  Administered 2023-04-12: 1000 mL via INTRAVENOUS

## 2023-04-12 MED ORDER — TAMSULOSIN HCL 0.4 MG PO CAPS
0.4000 mg | ORAL_CAPSULE | Freq: Every day | ORAL | Status: DC
  Administered 2023-04-12 – 2023-04-18 (×7): 0.4 mg via ORAL
  Filled 2023-04-12 (×7): qty 1

## 2023-04-12 MED ORDER — NIFEDIPINE ER OSMOTIC RELEASE 30 MG PO TB24
30.0000 mg | ORAL_TABLET | Freq: Every day | ORAL | Status: DC
  Administered 2023-04-13 – 2023-04-18 (×6): 30 mg via ORAL
  Filled 2023-04-12 (×6): qty 1

## 2023-04-12 MED ORDER — HEPARIN SODIUM (PORCINE) 5000 UNIT/ML IJ SOLN
5000.0000 [IU] | Freq: Three times a day (TID) | INTRAMUSCULAR | Status: DC
  Administered 2023-04-13 – 2023-04-18 (×14): 5000 [IU] via SUBCUTANEOUS
  Filled 2023-04-12 (×13): qty 1

## 2023-04-12 MED ORDER — SODIUM ZIRCONIUM CYCLOSILICATE 5 G PO PACK
5.0000 g | PACK | Freq: Once | ORAL | Status: AC
  Administered 2023-04-12: 5 g via ORAL
  Filled 2023-04-12: qty 1

## 2023-04-12 MED ORDER — ACETAMINOPHEN 650 MG RE SUPP: 650.0000 mg | Freq: Four times a day (QID) | RECTAL | Status: DC | PRN

## 2023-04-12 MED ORDER — OXYCODONE HCL 5 MG PO TABS
5.0000 mg | ORAL_TABLET | ORAL | Status: DC | PRN
  Administered 2023-04-15: 5 mg via ORAL
  Filled 2023-04-12: qty 1

## 2023-04-12 MED ORDER — ONDANSETRON HCL 4 MG PO TABS: 4.0000 mg | ORAL_TABLET | Freq: Four times a day (QID) | ORAL | Status: DC | PRN

## 2023-04-12 MED ORDER — OXYBUTYNIN CHLORIDE 5 MG PO TABS
5.0000 mg | ORAL_TABLET | Freq: Three times a day (TID) | ORAL | Status: DC | PRN
  Administered 2023-04-12 – 2023-04-16 (×7): 5 mg via ORAL
  Filled 2023-04-12 (×8): qty 1

## 2023-04-12 MED ORDER — ONDANSETRON HCL 4 MG/2ML IJ SOLN: 4.0000 mg | Freq: Four times a day (QID) | INTRAMUSCULAR | Status: DC | PRN

## 2023-04-12 MED ORDER — ACETAMINOPHEN 325 MG PO TABS
650.0000 mg | ORAL_TABLET | Freq: Four times a day (QID) | ORAL | Status: DC | PRN
  Administered 2023-04-17: 650 mg via ORAL
  Filled 2023-04-12: qty 2

## 2023-04-12 NOTE — H&P (Signed)
HISTORY AND PHYSICAL    MARCELLE KOMOROSKI   MVH:[id number] DOB: [birth year]   Date of Service: 04/12/23 Requesting physician/APP from ED: Treatment Team:  Attending Provider: Sunnie Nielsen, DO  PCP: Ellyn Hack, MD     HPI: William Bradshaw is a 67 y.o. male w/ PMH hypertension, presents to ED 04/12/2023 with chief complaint of dizziness, weakness, decreased appetite and abnormal labs from his PCP.  Reports difficulty emptying his bladder over the past several months, describing weak stream, straining to urinate, urgency/frequency.   In ED, UA concerning for UTI, urinary retention requiring Foley catheter placement by urology, mild hyperkalemia 5.6 without EKG changes, AKI on uncertain baseline kidney function with creatinine 3.78 and GFR 17, WBC normal at 8.6 and no elevation in lactate, mild anemia hemoglobin 10.1.  CT abdomen pelvis demonstrated moderate to severe right and left hydronephrosis, hydroureter, concern for bladder outlet obstruction and bladder wall thickening.  EDP consulted urology, Foley catheter was placed.  Hospitalist consulted for admission    Consultants:  Urology  Procedures: None      ASSESSMENT & PLAN:   Urinary retention likely due to BPH UTI, bladder distention and bladder diverticula, hydronephrosis, AKI likely sequela of urinary retention Difficult Foley placement UTI without sepsis Maintain Foley Urology to follow Continue ceftriaxone pending urine and blood cultures Closely monitor output, BMP.    Hyperkalemia Likely due to AKI Closely monitor BMP Hopefully will resolve with Foley placement/resolution of underlying cause  AKI likely due to urinary obstruction/UTI Creatinine in ED 3.78, most recent for comparison 1.951-year ago Closely monitor BMP Given IV fluid shortage we will not continue IV fluids for AKI as hopefully Foley placement will result in improvement with resolution of obstruction  Hypertension     DVT  prophylaxis: heparin given AKI Pertinent IV fluids/nutrition: s/p IV fluids in ED, will not continue at this time but low threshold to restart. Regular diet  Central lines / invasive devices: Foley catheter placed 04/12/23   Code Status: FULL CODE Family Communication: none at this time  Disposition: inpatient TOC needs: TBD Barriers to discharge / significant pending items: urine cultures and AKI/K improvement                   Review of Systems:  Review of Systems  Constitutional:  Positive for chills, malaise/fatigue and weight loss. Negative for diaphoresis and fever.  HENT:  Negative for congestion, sinus pain and sore throat.   Respiratory:  Negative for cough and shortness of breath.   Cardiovascular:  Negative for chest pain, palpitations and leg swelling.  Gastrointestinal:  Positive for nausea. Negative for blood in stool, constipation, heartburn and vomiting.  Genitourinary:  Positive for frequency and urgency. Negative for dysuria.  Musculoskeletal:  Negative for falls and myalgias.  Skin:  Negative for rash.  Neurological:  Positive for dizziness. Negative for focal weakness.  Psychiatric/Behavioral:  Negative for depression.        has a past medical history of Hypertension.  No current facility-administered medications on file prior to encounter.   Current Outpatient Medications on File Prior to Encounter  Medication Sig Dispense Refill   NIFEdipine (PROCARDIA-XL/NIFEDICAL-XL) 30 MG 24 hr tablet Take 30 mg by mouth daily.     amLODipine (NORVASC) 2.5 MG tablet Take 2.5 mg by mouth daily. (Patient not taking: Reported on 04/12/2023)     No Known Allergies  family history is not on file.  No past surgical history on file.  Objective Findings:  Vitals:   04/12/23 1753 04/12/23 1815 04/12/23 2215 04/12/23 2231  BP:  (!) 139/91 (!) 144/86   Pulse:  64 (!) 59   Resp:  (!) 25 19   Temp: 98.5 F (36.9 C)   98.4 F (36.9 C)   TempSrc: Oral   Oral  SpO2:  100% (!) 61%   Weight:      Height:       No intake or output data in the 24 hours ending 04/12/23 2327 Filed Weights   04/12/23 1407  Weight: 53.1 kg    Examination:  Physical Exam Constitutional:      Appearance: Normal appearance.  HENT:     Head: Normocephalic and atraumatic.  Eyes:     Extraocular Movements: Extraocular movements intact.  Cardiovascular:     Rate and Rhythm: Normal rate and regular rhythm.  Pulmonary:     Effort: Pulmonary effort is normal.     Breath sounds: Normal breath sounds.  Abdominal:     General: Abdomen is flat.     Palpations: Abdomen is soft.     Tenderness: There is abdominal tenderness (suprapubic).  Musculoskeletal:     Cervical back: Normal range of motion and neck supple.     Right lower leg: No edema.     Left lower leg: No edema.  Skin:    General: Skin is warm and dry.  Neurological:     General: No focal deficit present.     Mental Status: He is alert and oriented to person, place, and time.  Psychiatric:        Mood and Affect: Mood normal.        Behavior: Behavior normal.          Scheduled Medications:   heparin  5,000 Units Subcutaneous Q8H   tamsulosin  0.4 mg Oral Daily    Continuous Infusions:   PRN Medications:  acetaminophen **OR** acetaminophen, ondansetron **OR** ondansetron (ZOFRAN) IV, oxybutynin, oxyCODONE, polyethylene glycol  Antimicrobials:  Anti-infectives (From admission, onward)    Start     Dose/Rate Route Frequency Ordered Stop   04/12/23 1700  cefTRIAXone (ROCEPHIN) 1 g in sodium chloride 0.9 % 100 mL IVPB        1 g 200 mL/hr over 30 Minutes Intravenous  Once 04/12/23 1657 04/12/23 1832           Data Reviewed: I have personally reviewed following labs and imaging studies  CBC: Recent Labs  Lab 04/12/23 1421  WBC 8.6  HGB 10.1*  HCT 31.7*  MCV 97.5  PLT 451*   Basic Metabolic Panel: Recent Labs  Lab 04/12/23 1421  NA 138  K 5.6*   CL 108  CO2 17*  GLUCOSE 98  BUN 43*  CREATININE 3.78*  CALCIUM 9.2   GFR: Estimated Creatinine Clearance: 13.9 mL/min (A) (by C-G formula based on SCr of 3.78 mg/dL (H)). Liver Function Tests: Recent Labs  Lab 04/12/23 1421  AST 17  ALT 15  ALKPHOS 65  BILITOT 0.4  PROT 8.2*  ALBUMIN 3.2*   No results for input(s): "LIPASE", "AMYLASE" in the last 168 hours. No results for input(s): "AMMONIA" in the last 168 hours. Coagulation Profile: No results for input(s): "INR", "PROTIME" in the last 168 hours. Cardiac Enzymes: No results for input(s): "CKTOTAL", "CKMB", "CKMBINDEX", "TROPONINI" in the last 168 hours. BNP (last 3 results) No results for input(s): "PROBNP" in the last 8760 hours. HbA1C: No results for input(s): "HGBA1C" in the last 72  hours. CBG: No results for input(s): "GLUCAP" in the last 168 hours. Lipid Profile: No results for input(s): "CHOL", "HDL", "LDLCALC", "TRIG", "CHOLHDL", "LDLDIRECT" in the last 72 hours. Thyroid Function Tests: No results for input(s): "TSH", "T4TOTAL", "FREET4", "T3FREE", "THYROIDAB" in the last 72 hours. Anemia Panel: No results for input(s): "VITAMINB12", "FOLATE", "FERRITIN", "TIBC", "IRON", "RETICCTPCT" in the last 72 hours. Most Recent Urinalysis On File:     Component Value Date/Time   COLORURINE AMBER (A) 04/12/2023 1502   APPEARANCEUR TURBID (A) 04/12/2023 1502   LABSPEC 1.010 04/12/2023 1502   PHURINE 5.0 04/12/2023 1502   GLUCOSEU NEGATIVE 04/12/2023 1502   HGBUR SMALL (A) 04/12/2023 1502   BILIRUBINUR NEGATIVE 04/12/2023 1502   KETONESUR NEGATIVE 04/12/2023 1502   PROTEINUR 100 (A) 04/12/2023 1502   UROBILINOGEN 0.2 04/01/2010 1306   NITRITE NEGATIVE 04/12/2023 1502   LEUKOCYTESUR LARGE (A) 04/12/2023 1502   Sepsis Labs: @LABRCNTIP (procalcitonin:4,lacticidven:4)  No results found for this or any previous visit (from the past 240 hour(s)).       Radiology Studies: CT ABDOMEN PELVIS WO CONTRAST  Result  Date: 04/12/2023 CLINICAL DATA:  Abdominal pain, acute, nonlocalized. EXAM: CT ABDOMEN AND PELVIS WITHOUT CONTRAST TECHNIQUE: Multidetector CT imaging of the abdomen and pelvis was performed following the standard protocol without IV contrast. RADIATION DOSE REDUCTION: This exam was performed according to the departmental dose-optimization program which includes automated exposure control, adjustment of the mA and/or kV according to patient size and/or use of iterative reconstruction technique. COMPARISON:  Ultrasound of the abdominal aorta 04/11/2023. FINDINGS: Lower chest: The lung bases are clear without focal nodule, mass, or airspace disease. Heart size is normal. Hepatobiliary: No focal liver abnormality is seen. No gallstones, gallbladder wall thickening, or biliary dilatation. Pancreas: Unremarkable. No pancreatic ductal dilatation or surrounding inflammatory changes. Spleen: Normal in size without focal abnormality. Adrenals/Urinary Tract: Adrenal glands are normal bilaterally. Moderate to severe right and moderate left hydronephrosis is present. The ureters are dilated to the level of the UVJ. No stone or mass lesion is present in either kidney. Parenchymal density is more hypodense on the right. The urinary bladder is distended to the scratched at the urinary bladder is distended up to 12.7 cm in cephalo caudad dimension. Extensive irregular wall thickening is present throughout the urinary bladder. Stomach/Bowel: Stomach and duodenum are within normal limits. Small bowel is unremarkable. Terminal ileum is within normal limits. The ascending and transverse colon are within normal limits. The descending and sigmoid colon are normal. Vascular/Lymphatic: Atherosclerotic calcifications are present in the aorta and branch vessels. No aneurysm is present. No significant adenopathy is present. Reproductive: The prostate gland is mildly enlarged, measuring up to 3.5 cm. Other: No abdominal wall hernia or  abnormality. No abdominopelvic ascites. Musculoskeletal: Grade 1 degenerative anterolisthesis at L3-4 measures 7 mm secondary to bilateral L3 pars defects. Chronic endplate sclerotic changes are present at L3-4. Focal osseous lesions are present. Bony pelvis is within normal limits. The hips are located and normal bilaterally. IMPRESSION: 1. Moderate to severe right and moderate left hydronephrosis with dilation of the ureters to the level of the UVJ. 2. The urinary bladder is distended up to 12.7 cm in cephalo caudad dimension. Extensive irregular wall thickening is present throughout the urinary bladder. This is consistent with severe bladder outlet obstruction. Neoplasm is not excluded. Recommend Urology consultation. 3. Mildly enlarged prostate gland. 4. Grade 1 degenerative anterolisthesis at L3-4 secondary to bilateral L3 pars defects. 5.  Aortic Atherosclerosis (ICD10-I70.0). These results were called by telephone  at the time of interpretation on 04/12/2023 at 7:31 pm to provider [facility] , who verbally acknowledged these results. Electronically Signed   By: Marin Roberts M.D.   On: 04/12/2023 19:37   DG Chest 2 View  Result Date: 04/12/2023 CLINICAL DATA:  Cough.  Patient's lightheaded and feeling weak. EXAM: CHEST - 2 VIEW COMPARISON:  Two-view chest x-ray 12/29/2021 FINDINGS: The heart size and mediastinal contours are within normal limits. Both lungs are clear. The visualized skeletal structures are unremarkable. IMPRESSION: Negative two view chest x-ray Electronically Signed   By: Marin Roberts M.D.   On: 04/12/2023 19:18   US AORTA MEDICARE SCREENING  Result Date: 04/11/2023 CLINICAL DATA:  Male between 68-5 years of age with a smoking history. EXAM: US ABDOMINAL AORTA MEDICARE SCREENING TECHNIQUE: Ultrasound examination of the abdominal aorta was performed as a screening evaluation for abdominal aortic aneurysm. COMPARISON:  None Available. FINDINGS: Abdominal aortic  measurements as follows: Proximal:  2.4 x 2.0 cm Mid:  1.8 x 1.9 cm Distal:  1.6 x 1.5 cm IMPRESSION: No evidence of abdominal aortic aneurysm. Electronically Signed   By: Harmon Pier M.D.   On: 04/11/2023 16:37             LOS: 0 days       Sunnie Nielsen, DO [facility] 04/12/2023, 11:27 PM    Dictation software may have been used to generate the above note. Typos may occur and escape review in typed/dictated notes. Please contact Dr Lyn Hollingshead directly for clarity if needed.  Staff may message me via secure chat in Epic  but this may not receive an immediate response,  please page me for urgent matters!  If 7PM-7AM, please contact night coverage [email or web address]

## 2023-04-12 NOTE — Consult Note (Signed)
Urology Consult Note   Requesting Attending Physician:  Jacalyn Lefevre, MD Service Providing Consult: Urology  Consulting Attending: Dr. Berneice Heinrich   Reason for Consult:  difficult foley, BOO  HPI: William Bradshaw is seen in consultation for reasons noted above at the request of Jacalyn Lefevre, MD for evaluation of urinary retention and difficult foley.  Patient is a 67 year old male with a history of hypertension who was sent to the ER by his PCP for worsening AKI.  Creatinine in the ER is 3.78, most recent comparison value was 1.95 1 year ago.  He has no leukocytosis.  Clean-catch UA notable for large leukocytes, 11-20 RBCs, greater than 50 WBCs, urine cultures pending.  Lactate negative, urine drug screen notably cocaine positive.  CT scan was obtained showing moderate bilateral hydronephrosis to the level of the UVJ's with a severely distended urinary bladder during up to 12.5 cm.  Also notable for multiple diverticula and thick wall.  Patient notes that he has been having difficulty emptying his bladder for at least the past few months if not longer.  He has difficulty with weak stream, straining to urinate, incomplete emptying, urinary urgency, urinary frequency.  He denies dysuria or hematuria but does note that his urine has seemed a little cloudier lately.  He also endorses a 20 pound unintentional weight loss over the past few months.  He has a greater than 30-pack-year smoking history, no family history of prostate cancer or other cancers that he is aware of, he has never seen a urologist for any reason, no history of kidney stones.    He does not take any blood thinners.  No abdominal surgical history.  Foley catheter was placed over a wire at bedside, urine was notably quite chalky.  Only 600 cc immediately emptied, I do wonder if there is a significant amount of urine still left in his many bladder diverticula.   Past Medical History: Past Medical History:  Diagnosis Date    Hypertension     Past Surgical History:  No past surgical history on file.  Medication: Current Facility-Administered Medications  Medication Dose Route Frequency Provider Last Rate Last Admin   oxybutynin (DITROPAN) tablet 5 mg  5 mg Oral TID PRN Cathren Harsh, MD       tamsulosin [facility]) capsule 0.4 mg  0.4 mg Oral Daily Cathren Harsh, MD       Current Outpatient Medications  Medication Sig Dispense Refill   amLODipine (NORVASC) 2.5 MG tablet Take 2.5 mg by mouth daily.     NIFEdipine (PROCARDIA-XL/NIFEDICAL-XL) 30 MG 24 hr tablet Take 30 mg by mouth daily.      Allergies: No Known Allergies  Social History: Social History   Tobacco Use   Smoking status: Every Day    Current packs/day: 1.00    Types: Cigarettes  Substance Use Topics   Alcohol use: Yes    Comment: 2 beers/ day   Drug use: Not Currently    Types: Cocaine    Family History No family history on file.  Review of Systems 10 systems were reviewed and are negative except as noted specifically in the HPI.  Objective   Vital signs in last 24 hours: BP (!) 139/91   Pulse 64   Temp 98.5 F (36.9 C) (Oral)   Resp (!) 25   Ht 5\' 7"  (1.702 m)   Wt 53.1 kg   SpO2 100%   BMI 18.32 kg/m   Physical Exam General: NAD, A&O, resting, appropriate HEENT: [place]/AT,  EOMI, MMM Pulmonary: Normal work of breathing Cardiovascular: HDS, adequate peripheral perfusion Abdomen: Soft, NTTP, distended, bladder palpable above the umbilicus. GU: Circumcised Extremities: warm and well perfused Neuro: Appropriate, no focal neurological deficits  Most Recent Labs: Lab Results  Component Value Date   WBC 8.6 04/12/2023   HGB 10.1 (L) 04/12/2023   HCT 31.7 (L) 04/12/2023   PLT 451 (H) 04/12/2023    Lab Results  Component Value Date   NA 138 04/12/2023   K 5.6 (H) 04/12/2023   CL 108 04/12/2023   CO2 17 (L) 04/12/2023   BUN 43 (H) 04/12/2023   CREATININE 3.78 (H) 04/12/2023   CALCIUM 9.2 04/12/2023    No results  found for: "INR", "APTT"   Urine Culture: @[id number] (laburin,org,[id number])@   IMAGING: CT ABDOMEN PELVIS WO CONTRAST  Result Date: 04/12/2023 CLINICAL DATA:  Abdominal pain, acute, nonlocalized. EXAM: CT ABDOMEN AND PELVIS WITHOUT CONTRAST TECHNIQUE: Multidetector CT imaging of the abdomen and pelvis was performed following the standard protocol without IV contrast. RADIATION DOSE REDUCTION: This exam was performed according to the departmental dose-optimization program which includes automated exposure control, adjustment of the mA and/or kV according to patient size and/or use of iterative reconstruction technique. COMPARISON:  Ultrasound of the abdominal aorta 04/11/2023. FINDINGS: Lower chest: The lung bases are clear without focal nodule, mass, or airspace disease. Heart size is normal. Hepatobiliary: No focal liver abnormality is seen. No gallstones, gallbladder wall thickening, or biliary dilatation. Pancreas: Unremarkable. No pancreatic ductal dilatation or surrounding inflammatory changes. Spleen: Normal in size without focal abnormality. Adrenals/Urinary Tract: Adrenal glands are normal bilaterally. Moderate to severe right and moderate left hydronephrosis is present. The ureters are dilated to the level of the UVJ. No stone or mass lesion is present in either kidney. Parenchymal density is more hypodense on the right. The urinary bladder is distended to the scratched at the urinary bladder is distended up to 12.7 cm in cephalo caudad dimension. Extensive irregular wall thickening is present throughout the urinary bladder. Stomach/Bowel: Stomach and duodenum are within normal limits. Small bowel is unremarkable. Terminal ileum is within normal limits. The ascending and transverse colon are within normal limits. The descending and sigmoid colon are normal. Vascular/Lymphatic: Atherosclerotic calcifications are present in the aorta and branch vessels. No aneurysm is present. No significant  adenopathy is present. Reproductive: The prostate gland is mildly enlarged, measuring up to 3.5 cm. Other: No abdominal wall hernia or abnormality. No abdominopelvic ascites. Musculoskeletal: Grade 1 degenerative anterolisthesis at L3-4 measures 7 mm secondary to bilateral L3 pars defects. Chronic endplate sclerotic changes are present at L3-4. Focal osseous lesions are present. Bony pelvis is within normal limits. The hips are located and normal bilaterally. IMPRESSION: 1. Moderate to severe right and moderate left hydronephrosis with dilation of the ureters to the level of the UVJ. 2. The urinary bladder is distended up to 12.7 cm in cephalo caudad dimension. Extensive irregular wall thickening is present throughout the urinary bladder. This is consistent with severe bladder outlet obstruction. Neoplasm is not excluded. Recommend Urology consultation. 3. Mildly enlarged prostate gland. 4. Grade 1 degenerative anterolisthesis at L3-4 secondary to bilateral L3 pars defects. 5.  Aortic Atherosclerosis (ICD10-I70.0). These results were called by telephone at the time of interpretation on 04/12/2023 at 7:31 pm to provider [facility] , who verbally acknowledged these results. Electronically Signed   By: Marin Roberts M.D.   On: 04/12/2023 19:37   DG Chest 2 View  Result Date: 04/12/2023 CLINICAL DATA:  Cough.  Patient's lightheaded and feeling weak. EXAM: CHEST - 2 VIEW COMPARISON:  Two-view chest x-ray 12/29/2021 FINDINGS: The heart size and mediastinal contours are within normal limits. Both lungs are clear. The visualized skeletal structures are unremarkable. IMPRESSION: Negative two view chest x-ray Electronically Signed   By: Marin Roberts M.D.   On: 04/12/2023 19:18   US AORTA MEDICARE SCREENING  Result Date: 04/11/2023 CLINICAL DATA:  Male between 53-63 years of age with a smoking history. EXAM: US ABDOMINAL AORTA MEDICARE SCREENING TECHNIQUE: Ultrasound examination of the abdominal  aorta was performed as a screening evaluation for abdominal aortic aneurysm. COMPARISON:  None Available. FINDINGS: Abdominal aortic measurements as follows: Proximal:  2.4 x 2.0 cm Mid:  1.8 x 1.9 cm Distal:  1.6 x 1.5 cm IMPRESSION: No evidence of abdominal aortic aneurysm. Electronically Signed   By: Harmon Pier M.D.   On: 04/11/2023 16:37    ------  Assessment:  67 y.o. male with history of hypertension who presented with AKI likely at least partially secondary to bilateral hydronephrosis from severe bladder outlet obstruction.  Foley catheter was placed over a wire and urine is now clear yellow.   Discussed that his urinary symptoms and retention are likely secondary to an enlarged prostate.  At this time we cannot determine whether he has prostate cancer simply based on imaging.  Patient states that he thinks he had a PSA drawn at his PCP recently though I am unable to see these records.  I am hesitant to draw a PSA after he has had significant prostate manipulation with multiple catheter attempts and would like to defer this for at least 48 hours.  Recommendations: -Continue Foley catheter to drainage for at least 1 week.  Would prefer trial of void be done in our clinic given difficulty with Foley placement.  We will arrange for patient's follow-up. - Please do not obtain a PSA in the acute setting given significant prostate manipulation may falsely elevate his value - Ditropan 5 mg 3 times daily as needed ordered for bladder spasms - Flomax 0.4 mg daily ordered.  Please continue this medication on discharge. - Patient received 1 g ceftriaxone, this will cover meant urinary manipulation Foley placement. - Urine culture pending, treat as indicated   Thank you for this consult. Please contact the urology consult pager with any further questions/concerns.

## 2023-04-12 NOTE — ED Notes (Signed)
Patient transported to CT 

## 2023-04-12 NOTE — ED Notes (Signed)
Patient transported to X-ray 

## 2023-04-12 NOTE — ED Triage Notes (Signed)
Pt reports feeling lightheaded and having generalized weakness, losing weight, decreased appetite, current smoker for the last trwo weeks. Seen by PCP this week for a checkup, was called by office and told to come to the ED because of abnormal labs. Pt awake, alert, appropriate. VSS.

## 2023-04-12 NOTE — ED Provider Notes (Signed)
[place] EMERGENCY DEPARTMENT AT [facility] Provider Note   CSN: [id number] Arrival date & time: 04/12/23  1316     History  Chief Complaint  Patient presents with   Dizziness   Weakness    William Bradshaw is a 67 y.o. male.  Pt is a 67 yo male with pmhx significant for htn.  Pt went to his pcp earlier this week and had labs drawn.  Pt said his potassium was elevated and his kidney function was worse.  He was called and was told to come to the ED for further eval.  Pt also reports dysuria and difficulty urinating.  He had a psa drawn by his pcp, but he does not yet have results.  He has been losing weight and has not been eating as much.  Family said he's lost weight.       Home Medications Prior to Admission medications   Medication Sig Start Date End Date Taking? Authorizing Provider  NIFEdipine (PROCARDIA-XL/NIFEDICAL-XL) 30 MG 24 hr tablet Take 30 mg by mouth daily. 02/19/23  Yes [provider]  amLODipine (NORVASC) 2.5 MG tablet Take 2.5 mg by mouth daily. Patient not taking: Reported on 04/12/2023 05/11/19   [provider]      Allergies    Patient has no known allergies.    Review of Systems   Review of Systems  Constitutional:  Positive for appetite change and unexpected weight change.  Genitourinary:  Positive for difficulty urinating.  Neurological:  Positive for weakness.  All other systems reviewed and are negative.   Physical Exam Updated Vital Signs BP (!) 144/86   Pulse (!) 59   Temp 98.4 F (36.9 C) (Oral)   Resp 19   Ht 5\' 7"  (1.702 m)   Wt 53.1 kg   SpO2 (!) 61%   BMI 18.32 kg/m  Physical Exam Vitals and nursing note reviewed.  Constitutional:      Appearance: He is underweight.  HENT:     Head: Normocephalic.     Right Ear: External ear normal.     Left Ear: External ear normal.     Nose: Nose normal.     Mouth/Throat:     Mouth: Mucous membranes are moist.     Pharynx: Oropharynx is clear.  Eyes:      Extraocular Movements: Extraocular movements intact.     Conjunctiva/sclera: Conjunctivae normal.     Pupils: Pupils are equal, round, and reactive to light.  Cardiovascular:     Rate and Rhythm: Normal rate and regular rhythm.     Pulses: Normal pulses.     Heart sounds: Normal heart sounds.  Pulmonary:     Effort: Pulmonary effort is normal.     Breath sounds: Normal breath sounds.  Abdominal:     General: Abdomen is flat. Bowel sounds are normal.     Palpations: Abdomen is soft.  Musculoskeletal:        General: Normal range of motion.     Cervical back: Normal range of motion and neck supple.  Skin:    General: Skin is warm.     Capillary Refill: Capillary refill takes less than 2 seconds.  Neurological:     General: No focal deficit present.     Mental Status: He is alert and oriented to person, place, and time.  Psychiatric:        Mood and Affect: Mood normal.        Behavior: Behavior normal.  ED Results / Procedures / Treatments   Labs (all labs ordered are listed, but only abnormal results are displayed) Labs Reviewed  BASIC METABOLIC PANEL - Abnormal; Notable for the following components:      Result Value   Potassium 5.6 (*)    CO2 17 (*)    BUN 43 (*)    Creatinine, Ser 3.78 (*)    GFR, Estimated 17 (*)    All other components within normal limits  CBC - Abnormal; Notable for the following components:   RBC 3.25 (*)    Hemoglobin 10.1 (*)    HCT 31.7 (*)    Platelets 451 (*)    All other components within normal limits  URINALYSIS, ROUTINE W REFLEX MICROSCOPIC - Abnormal; Notable for the following components:   Color, Urine AMBER (*)    APPearance TURBID (*)    Hgb urine dipstick SMALL (*)    Protein, ur 100 (*)    Leukocytes,Ua LARGE (*)    Bacteria, UA RARE (*)    All other components within normal limits  RAPID URINE DRUG SCREEN, HOSP PERFORMED - Abnormal; Notable for the following components:   Cocaine POSITIVE (*)    All other components  within normal limits  HEPATIC FUNCTION PANEL - Abnormal; Notable for the following components:   Total Protein 8.2 (*)    Albumin 3.2 (*)    All other components within normal limits  URINE CULTURE  CULTURE, BLOOD (ROUTINE X 2)  CULTURE, BLOOD (ROUTINE X 2)  HEPATIC FUNCTION PANEL  CBG MONITORING, ED  I-STAT CG4 LACTIC ACID, ED  I-STAT CG4 LACTIC ACID, ED  POC OCCULT BLOOD, ED    EKG EKG Interpretation Date/Time:  Friday April 12 2023 14:15:16 EDT Ventricular Rate:  72 PR Interval:  122 QRS Duration:  80 QT Interval:  360 QTC Calculation: 394 R Axis:   80  Text Interpretation: Sinus rhythm with Premature atrial complexes Nonspecific T wave abnormality Abnormal ECG When compared with ECG of 29-Dec-2021 01:46, PREVIOUS ECG IS PRESENT No significant change since last tracing Confirmed by Jacalyn Lefevre [phone]) on 04/12/2023 7:13:14 PM  Radiology CT ABDOMEN PELVIS WO CONTRAST  Result Date: 04/12/2023 CLINICAL DATA:  Abdominal pain, acute, nonlocalized. EXAM: CT ABDOMEN AND PELVIS WITHOUT CONTRAST TECHNIQUE: Multidetector CT imaging of the abdomen and pelvis was performed following the standard protocol without IV contrast. RADIATION DOSE REDUCTION: This exam was performed according to the departmental dose-optimization program which includes automated exposure control, adjustment of the mA and/or kV according to patient size and/or use of iterative reconstruction technique. COMPARISON:  Ultrasound of the abdominal aorta 04/11/2023. FINDINGS: Lower chest: The lung bases are clear without focal nodule, mass, or airspace disease. Heart size is normal. Hepatobiliary: No focal liver abnormality is seen. No gallstones, gallbladder wall thickening, or biliary dilatation. Pancreas: Unremarkable. No pancreatic ductal dilatation or surrounding inflammatory changes. Spleen: Normal in size without focal abnormality. Adrenals/Urinary Tract: Adrenal glands are normal bilaterally. Moderate to severe  right and moderate left hydronephrosis is present. The ureters are dilated to the level of the UVJ. No stone or mass lesion is present in either kidney. Parenchymal density is more hypodense on the right. The urinary bladder is distended to the scratched at the urinary bladder is distended up to 12.7 cm in cephalo caudad dimension. Extensive irregular wall thickening is present throughout the urinary bladder. Stomach/Bowel: Stomach and duodenum are within normal limits. Small bowel is unremarkable. Terminal ileum is within normal limits. The ascending and transverse colon  are within normal limits. The descending and sigmoid colon are normal. Vascular/Lymphatic: Atherosclerotic calcifications are present in the aorta and branch vessels. No aneurysm is present. No significant adenopathy is present. Reproductive: The prostate gland is mildly enlarged, measuring up to 3.5 cm. Other: No abdominal wall hernia or abnormality. No abdominopelvic ascites. Musculoskeletal: Grade 1 degenerative anterolisthesis at L3-4 measures 7 mm secondary to bilateral L3 pars defects. Chronic endplate sclerotic changes are present at L3-4. Focal osseous lesions are present. Bony pelvis is within normal limits. The hips are located and normal bilaterally. IMPRESSION: 1. Moderate to severe right and moderate left hydronephrosis with dilation of the ureters to the level of the UVJ. 2. The urinary bladder is distended up to 12.7 cm in cephalo caudad dimension. Extensive irregular wall thickening is present throughout the urinary bladder. This is consistent with severe bladder outlet obstruction. Neoplasm is not excluded. Recommend Urology consultation. 3. Mildly enlarged prostate gland. 4. Grade 1 degenerative anterolisthesis at L3-4 secondary to bilateral L3 pars defects. 5.  Aortic Atherosclerosis (ICD10-I70.0). These results were called by telephone at the time of interpretation on 04/12/2023 at 7:31 pm to provider [facility] , who  verbally acknowledged these results. Electronically Signed   By: Marin Roberts M.D.   On: 04/12/2023 19:37   DG Chest 2 View  Result Date: 04/12/2023 CLINICAL DATA:  Cough.  Patient's lightheaded and feeling weak. EXAM: CHEST - 2 VIEW COMPARISON:  Two-view chest x-ray 12/29/2021 FINDINGS: The heart size and mediastinal contours are within normal limits. Both lungs are clear. The visualized skeletal structures are unremarkable. IMPRESSION: Negative two view chest x-ray Electronically Signed   By: Marin Roberts M.D.   On: 04/12/2023 19:18   US AORTA MEDICARE SCREENING  Result Date: 04/11/2023 CLINICAL DATA:  Male between 38-2 years of age with a smoking history. EXAM: US ABDOMINAL AORTA MEDICARE SCREENING TECHNIQUE: Ultrasound examination of the abdominal aorta was performed as a screening evaluation for abdominal aortic aneurysm. COMPARISON:  None Available. FINDINGS: Abdominal aortic measurements as follows: Proximal:  2.4 x 2.0 cm Mid:  1.8 x 1.9 cm Distal:  1.6 x 1.5 cm IMPRESSION: No evidence of abdominal aortic aneurysm. Electronically Signed   By: Harmon Pier M.D.   On: 04/11/2023 16:37    Procedures Procedures    Medications Ordered in ED Medications  oxybutynin (DITROPAN) tablet 5 mg (5 mg Oral Given 04/12/23 2205)  tamsulosin (FLOMAX) capsule 0.4 mg (0.4 mg Oral Given 04/12/23 2205)  sodium chloride 0.9 % bolus 1,000 mL (0 mLs Intravenous Stopped 04/12/23 1916)  sodium zirconium cyclosilicate (LOKELMA) packet 5 g (5 g Oral Given 04/12/23 1817)  cefTRIAXone (ROCEPHIN) 1 g in sodium chloride 0.9 % 100 mL IVPB (0 g Intravenous Stopped 04/12/23 1832)    ED Course/ Medical Decision Making/ A&P                                 Medical Decision Making Amount and/or Complexity of Data Reviewed Labs: ordered. Radiology: ordered.  Risk Prescription drug management. Decision regarding hospitalization.   This patient presents to the ED for concern of weakness, this  involves an extensive number of treatment options, and is a complaint that carries with it a high risk of complications and morbidity.  The differential diagnosis includes aki, dehydration, electrolyte abn, infection   Co morbidities that complicate the patient evaluation  htn   Additional history obtained:  Additional history obtained from epic  chart review  Lab Tests:  I Ordered, and personally interpreted labs.  The pertinent results include:  ua + for uti, bmp with k elevated at 5.6, bun43 and cr 3.78 (bun 17 and cr 1.95 a year ago), cbc with hgb low at 10.1 (hgb 12.5 a year ago)   Imaging Studies ordered:  I ordered imaging studies including ct abd pelvis  I independently visualized and interpreted imaging which showed  Moderate to severe right and moderate left hydronephrosis with  dilation of the ureters to the level of the UVJ.  2. The urinary bladder is distended up to 12.7 cm in cephalo caudad  dimension. Extensive irregular wall thickening is present throughout  the urinary bladder. This is consistent with severe bladder outlet  obstruction. Neoplasm is not excluded. Recommend Urology  consultation.  3. Mildly enlarged prostate gland.  4. Grade 1 degenerative anterolisthesis at L3-4 secondary to  bilateral L3 pars defects.  5.  Aortic Atherosclerosis (ICD10-I70.0).   I agree with the radiologist interpretation   Cardiac Monitoring:  The patient was maintained on a cardiac monitor.  I personally viewed and interpreted the cardiac monitored which showed an underlying rhythm of: nsr   Medicines ordered and prescription drug management:  I ordered medication including ivfs/rocephin  for aki and  uti  Reevaluation of the patient after these medicines showed that the patient improved I have reviewed the patients home medicines and have made adjustments as needed   Test Considered:  ct   Critical Interventions:  foley   Consultations Obtained:  I  requested consultation with the urologist (Dr. Cathren Harsh),  and discussed lab and imaging findings as well as pertinent plan - she placed a foley Pt d/w Dr. Lyn Hollingshead ([facility]ad) who will admit   Problem List / ED Course:  AKI:  ivfs given.  This is likely due to bladder outlet obstruction.   Urinary retention:  foley unable to be placed by nurses or myself.  Urology will place. UTI:  IV rocephin given   Reevaluation:  After the interventions noted above, I reevaluated the patient and found that they have :improved   Social Determinants of Health:  Lives with family   Dispostion:  After consideration of the diagnostic results and the patients response to treatment, I feel that the patent would benefit from admission.          Final Clinical Impression(s) / ED Diagnoses Final diagnoses:  AKI (acute kidney injury) (HCC)  Bladder outlet obstruction  Acute cystitis without hematuria    Rx / DC Orders ED Discharge Orders     None         Jacalyn Lefevre, MD 04/12/23 2247

## 2023-04-12 NOTE — Procedures (Signed)
Foley Catheter Placement Note  Indications: 67 y.o. male with AKI and bilateral hydronephrosis with severely distended bladder on CT scan. 4 attempts at catheter placement by nursing were unsuccessful.   Pre-operative Diagnosis: Urinary retention  Post-operative Diagnosis: Same  Surgeon: Cathren Harsh, MD  Assistants: None  Procedure Details  Patient was placed in the supine position, prepped with Betadine and draped in the usual sterile fashion.  We injected lidocaine jelly per urethra prior to the procedure.  I passed a 0.35 sensor wire into the bladder. Over this I passed a 13F open ended catheter and removed the wire. While aspirating with an empty syringe, I slowly withdrew the catheter until I aspirated yellow urine, confirming bladder position. I then replaced the sensor wire and removed the 13F. Over the wire I passed an 70F council tip catheter with moderate resistance in the prostatic urethra. There was return of clear yellow urine and then proceeded to insert 10 mL of sterile water into the Foley balloon.  The catheter was attached to a drainage bag and secured with a StatLock. There was return of 600cc of urine which was initially clear yellow and then became chalky white. I then irrigated the catheter with sterile water to clear.                Complications: None; patient tolerated the procedure well.  Plan:   1.  Continue Foley catheter to drainage  2. Ditropan and flomax ordered 3. See urology consult note for more details       Attending Attestation: Dr. Berneice Heinrich was available.

## 2023-04-12 NOTE — ED Notes (Signed)
4 Unsuccessful attempts to insert foley, by this paramedic, NT, two RN's and the MD [place].  Urology contacted by MD.

## 2023-04-13 ENCOUNTER — Encounter (HOSPITAL_COMMUNITY): Payer: Self-pay | Admitting: Osteopathic Medicine

## 2023-04-13 DIAGNOSIS — R339 Retention of urine, unspecified: Secondary | ICD-10-CM

## 2023-04-13 LAB — BASIC METABOLIC PANEL
Anion gap: 9 (ref 5–15)
BUN: 36 mg/dL — ABNORMAL HIGH (ref 8–23)
CO2: 21 mmol/L — ABNORMAL LOW (ref 22–32)
Calcium: 8.9 mg/dL (ref 8.9–10.3)
Chloride: 107 mmol/L (ref 98–111)
Creatinine, Ser: 3.4 mg/dL — ABNORMAL HIGH (ref 0.61–1.24)
GFR, Estimated: 19 mL/min — ABNORMAL LOW (ref >60)
Glucose, Bld: 224 mg/dL — ABNORMAL HIGH (ref 70–99)
Potassium: 4.7 mmol/L (ref 3.5–5.1)
Sodium: 137 mmol/L (ref 135–145)

## 2023-04-13 LAB — CBC
HCT: 31.2 % — ABNORMAL LOW (ref 39.0–52.0)
HCT: 31.5 % — ABNORMAL LOW (ref 39.0–52.0)
Hemoglobin: 10 g/dL — ABNORMAL LOW (ref 13.0–17.0)
Hemoglobin: 10.2 g/dL — ABNORMAL LOW (ref 13.0–17.0)
MCH: 31 pg (ref 26.0–34.0)
MCH: 30.8 pg (ref 26.0–34.0)
MCHC: 32.1 g/dL (ref 30.0–36.0)
MCHC: 32.4 g/dL (ref 30.0–36.0)
MCV: 96.6 fL (ref 80.0–100.0)
MCV: 95.2 fL (ref 80.0–100.0)
Platelets: 440 10*3/uL — ABNORMAL HIGH (ref 150–400)
Platelets: 452 10*3/uL — ABNORMAL HIGH (ref 150–400)
RBC: 3.23 MIL/uL — ABNORMAL LOW (ref 4.22–5.81)
RBC: 3.31 MIL/uL — ABNORMAL LOW (ref 4.22–5.81)
RDW: 12.5 % (ref 11.5–15.5)
RDW: 12.6 % (ref 11.5–15.5)
WBC: 8.4 10*3/uL (ref 4.0–10.5)
WBC: 11.5 10*3/uL — ABNORMAL HIGH (ref 4.0–10.5)
nRBC: 0 % (ref 0.0–0.2)
nRBC: 0 % (ref 0.0–0.2)

## 2023-04-13 LAB — CREATININE, SERUM
Creatinine, Ser: 3.52 mg/dL — ABNORMAL HIGH (ref 0.61–1.24)
GFR, Estimated: 18 mL/min — ABNORMAL LOW (ref >60)

## 2023-04-13 LAB — URINE CULTURE: Culture: >=100000 — AB

## 2023-04-13 LAB — HEPATIC FUNCTION PANEL
ALT: 16 U/L (ref 0–44)
AST: 16 U/L (ref 15–41)
Albumin: 3.1 g/dL — ABNORMAL LOW (ref 3.5–5.0)
Alkaline Phosphatase: 71 U/L (ref 38–126)
Bilirubin, Direct: <0.1 mg/dL (ref 0.0–0.2)
Total Bilirubin: <0.1 mg/dL — ABNORMAL LOW (ref 0.3–1.2)
Total Protein: 8.1 g/dL (ref 6.5–8.1)

## 2023-04-13 LAB — HIV ANTIBODY (ROUTINE TESTING W REFLEX): HIV Screen 4th Generation wRfx: NONREACTIVE

## 2023-04-13 MED ORDER — FINASTERIDE 5 MG PO TABS
5.0000 mg | ORAL_TABLET | Freq: Every day | ORAL | Status: DC
  Administered 2023-04-13 – 2023-04-18 (×6): 5 mg via ORAL
  Filled 2023-04-13 (×6): qty 1

## 2023-04-13 MED ORDER — SODIUM CHLORIDE 0.9 % IV SOLN: INTRAVENOUS | Status: AC

## 2023-04-13 MED ORDER — CHLORHEXIDINE GLUCONATE CLOTH 2 % EX PADS
6.0000 | MEDICATED_PAD | Freq: Every day | CUTANEOUS | Status: DC
  Administered 2023-04-13 – 2023-04-17 (×5): 6 via TOPICAL

## 2023-04-13 MED ORDER — SODIUM CHLORIDE 0.9 % IV SOLN
1.0000 g | INTRAVENOUS | Status: DC
  Administered 2023-04-13 – 2023-04-17 (×5): 1 g via INTRAVENOUS
  Filled 2023-04-13 (×5): qty 10

## 2023-04-13 MED ORDER — MELATONIN 5 MG PO TABS
10.0000 mg | ORAL_TABLET | Freq: Every evening | ORAL | Status: DC | PRN
  Administered 2023-04-13 – 2023-04-17 (×6): 10 mg via ORAL
  Filled 2023-04-13 (×6): qty 2

## 2023-04-13 MED ORDER — TAMSULOSIN HCL 0.4 MG PO CAPS: 0.4000 mg | ORAL_CAPSULE | Freq: Every day | ORAL | 3 refills | Status: DC

## 2023-04-13 MED ORDER — ENSURE ENLIVE PO LIQD
237.0000 mL | Freq: Two times a day (BID) | ORAL | Status: DC
  Administered 2023-04-13 – 2023-04-18 (×8): 237 mL via ORAL

## 2023-04-13 MED ORDER — FINASTERIDE 5 MG PO TABS: 5.0000 mg | ORAL_TABLET | Freq: Every day | ORAL | 3 refills | Status: DC

## 2023-04-13 NOTE — Progress Notes (Signed)
PROGRESS NOTE  William Bradshaw WUJ:[id number] DOB: 1955-09-28 DOA: 04/12/2023 PCP: Ellyn Hack, MD  HPI/Recap of past 24 hours: William Bradshaw is a 67 y.o. male w/ PMH hypertension, presents to ED 04/12/2023 with chief complaint of difficulty emptying his bladder over the past several months, describing weak stream, straining to urinate, urgency/frequency as well as abnormal labs from his PCP. Also reports weight loss and poor appetite with weakness. In ED, UA concerning for UTI, urinary retention requiring Foley catheter placement by urology, mild hyperkalemia 5.6 without EKG changes, AKI on uncertain baseline kidney function with creatinine 3.78 and GFR 17. CT a/p demonstrated moderate to severe right and left hydronephrosis, hydroureter, concern for bladder outlet obstruction and bladder wall thickening.  EDP consulted urology. Hospitalist consulted for admission.   Today, pt denies any new complaints. Reporting he still has some mild discomfort around his pubic area, has to get up from bed to relieve ??pressure. Denies any chest pain, SOB, fever/chills.    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 Urinary bladder is distended up to 12.7 cm in cephalo caudad dimension. Extensive irregular wall thickening is present throughout the urinary bladder. Neoplasm cannot be ruled out Prostate mildly enlarged Difficult Foley placement (done by urology)- d/c with foley for outpt voiding trial Flomax, finasteride Urology on board, outpt follow up for cysto and PSA  Anemia  of chronic disease Hgb around baseline Daily CBC  Possible UTI Afebrile with mild leukocytosis UA with large leukocytes, rare bacteria, >50 WBC UC growing >100,000 group B strep agalactiae  BC X 2 pending Continue ceftriaxone for now   Hypertension BP stable Continue nifedipine  Tobacco and cocaine abuse Extensive counseling, advised to quit    Estimated body mass index is 17.28 kg/m as calculated from the following:   Height as of this encounter: 5\' 7"  (1.702 m).   Weight as of this encounter: 50 kg.     Code Status: Full  Family Communication: None at bedside  Disposition Plan: Status is: Inpatient Remains inpatient appropriate because: level of care      Consultants: Urology  Procedures: None  Antimicrobials: Ceftriaxone  DVT prophylaxis:  heparin   Objective: Vitals:   04/13/23 0109 04/13/23 0128 04/13/23 0543 04/13/23 0842  BP: (!) 159/100  107/74 116/77  Pulse: 82  84 80  Resp: 18  19 18   Temp: 99.1 F (37.3 C)  98.8 F (37.1 C) 98.2 F (36.8 C)  TempSrc:    Oral  SpO2: 99%  98% 99%  Weight:  50 kg    Height:  5\' 7"  (1.702 m)      Intake/Output Summary (Last 24 hours) at 04/13/2023 1502 Last data filed at 04/13/2023 1211 Gross per 24 hour  Intake 650 ml  Output 0 ml  Net 650 ml   Filed Weights   04/12/23 1407 04/13/23 0128  Weight: 53.1 kg 50 kg    Exam: General: NAD, thin Cardiovascular: S1, S2 present Respiratory: CTAB Abdomen: Soft, nontender, nondistended, bowel sounds present Musculoskeletal: No bilateral pedal edema noted Skin: Normal Psychiatry: Normal mood    Data  Reviewed: CBC: Recent Labs  Lab 04/12/23 1421 04/13/23 0136 04/13/23 0325  WBC 8.6 8.4 11.5*  HGB 10.1* 10.0* 10.2*  HCT 31.7* 31.2* 31.5*  MCV 97.5 96.6 95.2  PLT 451* 440* 452*   Basic Metabolic Panel: Recent Labs  Lab 04/12/23 1421 04/13/23 0136 04/13/23 0325  NA 138  --  137  K 5.6*  --  4.7  CL 108  --  107  CO2 17*  --  21*   GLUCOSE 98  --  224*  BUN 43*  --  36*  CREATININE 3.78* 3.52* 3.40*  CALCIUM 9.2  --  8.9   GFR: Estimated Creatinine Clearance: 14.5 mL/min (A) (by C-G formula based on SCr of 3.4 mg/dL (H)). Liver Function Tests: Recent Labs  Lab 04/12/23 1421 04/13/23 0136  AST 17 16  ALT 15 16  ALKPHOS 65 71  BILITOT 0.4 <0.1*  PROT 8.2* 8.1  ALBUMIN 3.2* 3.1*   No results for input(s): "LIPASE", "AMYLASE" in the last 168 hours. No results for input(s): "AMMONIA" in the last 168 hours. Coagulation Profile: No results for input(s): "INR", "PROTIME" in the last 168 hours. Cardiac Enzymes: No results for input(s): "CKTOTAL", "CKMB", "CKMBINDEX", "TROPONINI" in the last 168 hours. BNP (last 3 results) No results for input(s): "PROBNP" in the last 8760 hours. HbA1C: No results for input(s): "HGBA1C" in the last 72 hours. CBG: No results for input(s): "GLUCAP" in the last 168 hours. Lipid Profile: No results for input(s): "CHOL", "HDL", "LDLCALC", "TRIG", "CHOLHDL", "LDLDIRECT" in the last 72 hours. Thyroid Function Tests: No results for input(s): "TSH", "T4TOTAL", "FREET4", "T3FREE", "THYROIDAB" in the last 72 hours. Anemia Panel: No results for input(s): "VITAMINB12", "FOLATE", "FERRITIN", "TIBC", "IRON", "RETICCTPCT" in the last 72 hours. Urine analysis:    Component Value Date/Time   COLORURINE AMBER (A) 04/12/2023 1502   APPEARANCEUR TURBID (A) 04/12/2023 1502   LABSPEC 1.010 04/12/2023 1502   PHURINE 5.0 04/12/2023 1502   GLUCOSEU NEGATIVE 04/12/2023 1502   HGBUR SMALL (A) 04/12/2023 1502   BILIRUBINUR NEGATIVE 04/12/2023 1502   KETONESUR NEGATIVE 04/12/2023 1502   PROTEINUR 100 (A) 04/12/2023 1502   UROBILINOGEN 0.2 04/01/2010 1306   NITRITE NEGATIVE 04/12/2023 1502   LEUKOCYTESUR LARGE (A) 04/12/2023 1502   Sepsis Labs: @LABRCNTIP (procalcitonin:4,lacticidven:4)  ) Recent Results (from the past 240 hour(s))  Urine Culture     Status: Abnormal   Collection Time:  04/12/23  4:57 PM   Specimen: Urine, Clean Catch  Result Value Ref Range Status   Specimen Description URINE, CLEAN CATCH  Final   Special Requests NONE  Final   Culture (A)  Final    >=100,000 COLONIES/mL GROUP B STREP(S.AGALACTIAE)ISOLATED TESTING AGAINST S. AGALACTIAE NOT ROUTINELY PERFORMED DUE TO PREDICTABILITY OF AMP/PEN/VAN SUSCEPTIBILITY. Performed at [facility], [address], [place], [place] [address]    Report Status 04/13/2023 FINAL  Final  Culture, blood (routine x 2)     Status: None (Preliminary result)   Collection Time: 04/12/23  5:39 PM   Specimen: BLOOD  Result Value Ref Range Status   Specimen Description BLOOD LEFT ANTECUBITAL  Final   Special Requests   Final    AEROBIC BOTTLE ONLY Blood Culture results may not be optimal due to an inadequate volume of blood received in culture bottles   Culture   Final    NO GROWTH < 24 HOURS Performed at [facility], [address], [place], [place] [address]    Report Status PENDING  Incomplete  Studies: CT ABDOMEN PELVIS WO CONTRAST  Result Date: 04/12/2023 CLINICAL DATA:  Abdominal pain, acute, nonlocalized. EXAM: CT ABDOMEN AND PELVIS WITHOUT CONTRAST TECHNIQUE: Multidetector CT imaging of the abdomen and pelvis was performed following the standard protocol without IV contrast. RADIATION DOSE REDUCTION: This exam was performed according to the departmental dose-optimization program which includes automated exposure control, adjustment of the mA and/or kV according to patient size and/or use of iterative reconstruction technique. COMPARISON:  Ultrasound of the abdominal aorta 04/11/2023. FINDINGS: Lower chest: The lung bases are clear without focal nodule, mass, or airspace disease. Heart size is normal. Hepatobiliary: No focal liver abnormality is seen. No gallstones, gallbladder wall thickening, or biliary dilatation. Pancreas: Unremarkable. No pancreatic ductal dilatation or surrounding inflammatory  changes. Spleen: Normal in size without focal abnormality. Adrenals/Urinary Tract: Adrenal glands are normal bilaterally. Moderate to severe right and moderate left hydronephrosis is present. The ureters are dilated to the level of the UVJ. No stone or mass lesion is present in either kidney. Parenchymal density is more hypodense on the right. The urinary bladder is distended to the scratched at the urinary bladder is distended up to 12.7 cm in cephalo caudad dimension. Extensive irregular wall thickening is present throughout the urinary bladder. Stomach/Bowel: Stomach and duodenum are within normal limits. Small bowel is unremarkable. Terminal ileum is within normal limits. The ascending and transverse colon are within normal limits. The descending and sigmoid colon are normal. Vascular/Lymphatic: Atherosclerotic calcifications are present in the aorta and branch vessels. No aneurysm is present. No significant adenopathy is present. Reproductive: The prostate gland is mildly enlarged, measuring up to 3.5 cm. Other: No abdominal wall hernia or abnormality. No abdominopelvic ascites. Musculoskeletal: Grade 1 degenerative anterolisthesis at L3-4 measures 7 mm secondary to bilateral L3 pars defects. Chronic endplate sclerotic changes are present at L3-4. Focal osseous lesions are present. Bony pelvis is within normal limits. The hips are located and normal bilaterally. IMPRESSION: 1. Moderate to severe right and moderate left hydronephrosis with dilation of the ureters to the level of the UVJ. 2. The urinary bladder is distended up to 12.7 cm in cephalo caudad dimension. Extensive irregular wall thickening is present throughout the urinary bladder. This is consistent with severe bladder outlet obstruction. Neoplasm is not excluded. Recommend Urology consultation. 3. Mildly enlarged prostate gland. 4. Grade 1 degenerative anterolisthesis at L3-4 secondary to bilateral L3 pars defects. 5.  Aortic Atherosclerosis  (ICD10-I70.0). These results were called by telephone at the time of interpretation on 04/12/2023 at 7:31 pm to provider [facility] , who verbally acknowledged these results. Electronically Signed   By: Marin Roberts M.D.   On: 04/12/2023 19:37   DG Chest 2 View  Result Date: 04/12/2023 CLINICAL DATA:  Cough.  Patient's lightheaded and feeling weak. EXAM: CHEST - 2 VIEW COMPARISON:  Two-view chest x-ray 12/29/2021 FINDINGS: The heart size and mediastinal contours are within normal limits. Both lungs are clear. The visualized skeletal structures are unremarkable. IMPRESSION: Negative two view chest x-ray Electronically Signed   By: Marin Roberts M.D.   On: 04/12/2023 19:18    Scheduled Meds:  Chlorhexidine Gluconate Cloth  6 each Topical Daily   feeding supplement  237 mL Oral BID BM   finasteride  5 mg Oral Daily   heparin  5,000 Units Subcutaneous Q8H   NIFEdipine  30 mg Oral Daily   tamsulosin  0.4 mg Oral Daily    Continuous Infusions:  sodium chloride 100 mL/hr at 04/13/23 0846   cefTRIAXone (ROCEPHIN)  IV       LOS: 1 day     Briant Cedar, MD [facility]  If 7PM-7AM, please contact night-coverage [email or web address] 04/13/2023, 3:02 PM

## 2023-04-13 NOTE — Progress Notes (Signed)
Insomnia: Patient reported he takes melatonin 10 mg at home.  Restarted.  Tereasa Coop, MD [facility] 04/13/2023, 2:03 AM

## 2023-04-13 NOTE — Plan of Care (Signed)

## 2023-04-13 NOTE — Progress Notes (Signed)
Subjective/Chief Complaint:  1 - Urinary Retention / Bilateral Hydro / Acute on Chronic Renal Insufficiency - new retention at ER eval 10/11. Prostate NOT enalged at 25gm by CT ellipsoid calculateion. Urol catheter placement 10/11. Cr 3.4 up from baseline 2's and bilateral hydro to distended bladder. Started on tamsulosin + finasterde this admission.   2 - Hematuria - hematuria presnt prior to catheter placement. CT with trabeculated bladder. Long term smoker.  Today "William Bradshaw " (pronounced like Vin-ent) is stable. Catheter draining well.   Objective: Vital signs in last 24 hours: Temp:  [98.2 F (36.8 C)-99.1 F (37.3 C)] 98.2 F (36.8 C) (10/12 0842) Pulse Rate:  [59-122] 80 (10/12 0842) Resp:  [14-25] 18 (10/12 0842) BP: (103-169)/(72-121) 116/77 (10/12 0842) SpO2:  [61 %-100 %] 99 % (10/12 0842) Weight:  [50 kg-53.1 kg] 50 kg (10/12 0128) Last BM Date : 04/12/23  Intake/Output from previous day: No intake/output data recorded. Intake/Output this shift: No intake/output data recorded.  Pleasant, jovial dispositon Fair dentition Non-labored breathing on RA This abd, no r/g No CVAT. Foely in place with very light tea-colored urine non-foul NO c/c/e  Lab Results:  Recent Labs    04/13/23 0136 04/13/23 0325  WBC 8.4 11.5*  HGB 10.0* 10.2*  HCT 31.2* 31.5*  PLT 440* 452*   BMET Recent Labs    04/12/23 1421 04/13/23 0136 04/13/23 0325  NA 138  --  137  K 5.6*  --  4.7  CL 108  --  107  CO2 17*  --  21*  GLUCOSE 98  --  224*  BUN 43*  --  36*  CREATININE 3.78* 3.52* 3.40*  CALCIUM 9.2  --  8.9   PT/INR No results for input(s): "LABPROT", "INR" in the last 72 hours. ABG No results for input(s): "PHART", "HCO3" in the last 72 hours.  Invalid input(s): "PCO2", "PO2"  Studies/Results: CT ABDOMEN PELVIS WO CONTRAST  Result Date: 04/12/2023 CLINICAL DATA:  Abdominal pain, acute, nonlocalized. EXAM: CT ABDOMEN AND PELVIS WITHOUT CONTRAST TECHNIQUE:  Multidetector CT imaging of the abdomen and pelvis was performed following the standard protocol without IV contrast. RADIATION DOSE REDUCTION: This exam was performed according to the departmental dose-optimization program which includes automated exposure control, adjustment of the mA and/or kV according to patient size and/or use of iterative reconstruction technique. COMPARISON:  Ultrasound of the abdominal aorta 04/11/2023. FINDINGS: Lower chest: The lung bases are clear without focal nodule, mass, or airspace disease. Heart size is normal. Hepatobiliary: No focal liver abnormality is seen. No gallstones, gallbladder wall thickening, or biliary dilatation. Pancreas: Unremarkable. No pancreatic ductal dilatation or surrounding inflammatory changes. Spleen: Normal in size without focal abnormality. Adrenals/Urinary Tract: Adrenal glands are normal bilaterally. Moderate to severe right and moderate left hydronephrosis is present. The ureters are dilated to the level of the UVJ. No stone or mass lesion is present in either kidney. Parenchymal density is more hypodense on the right. The urinary bladder is distended to the scratched at the urinary bladder is distended up to 12.7 cm in cephalo caudad dimension. Extensive irregular wall thickening is present throughout the urinary bladder. Stomach/Bowel: Stomach and duodenum are within normal limits. Small bowel is unremarkable. Terminal ileum is within normal limits. The ascending and transverse colon are within normal limits. The descending and sigmoid colon are normal. Vascular/Lymphatic: Atherosclerotic calcifications are present in the aorta and branch vessels. No aneurysm is present. No significant adenopathy is present. Reproductive: The prostate gland is mildly enlarged, measuring up  to 3.5 cm. Other: No abdominal wall hernia or abnormality. No abdominopelvic ascites. Musculoskeletal: Grade 1 degenerative anterolisthesis at L3-4 measures 7 mm secondary to  bilateral L3 pars defects. Chronic endplate sclerotic changes are present at L3-4. Focal osseous lesions are present. Bony pelvis is within normal limits. The hips are located and normal bilaterally. IMPRESSION: 1. Moderate to severe right and moderate left hydronephrosis with dilation of the ureters to the level of the UVJ. 2. The urinary bladder is distended up to 12.7 cm in cephalo caudad dimension. Extensive irregular wall thickening is present throughout the urinary bladder. This is consistent with severe bladder outlet obstruction. Neoplasm is not excluded. Recommend Urology consultation. 3. Mildly enlarged prostate gland. 4. Grade 1 degenerative anterolisthesis at L3-4 secondary to bilateral L3 pars defects. 5.  Aortic Atherosclerosis (ICD10-I70.0). These results were called by telephone at the time of interpretation on 04/12/2023 at 7:31 pm to provider [facility] , who verbally acknowledged these results. Electronically Signed   By: Marin Roberts M.D.   On: 04/12/2023 19:37   DG Chest 2 View  Result Date: 04/12/2023 CLINICAL DATA:  Cough.  Patient's lightheaded and feeling weak. EXAM: CHEST - 2 VIEW COMPARISON:  Two-view chest x-ray 12/29/2021 FINDINGS: The heart size and mediastinal contours are within normal limits. Both lungs are clear. The visualized skeletal structures are unremarkable. IMPRESSION: Negative two view chest x-ray Electronically Signed   By: Marin Roberts M.D.   On: 04/12/2023 19:18   US AORTA MEDICARE SCREENING  Result Date: 04/11/2023 CLINICAL DATA:  Male between 48-51 years of age with a smoking history. EXAM: US ABDOMINAL AORTA MEDICARE SCREENING TECHNIQUE: Ultrasound examination of the abdominal aorta was performed as a screening evaluation for abdominal aortic aneurysm. COMPARISON:  None Available. FINDINGS: Abdominal aortic measurements as follows: Proximal:  2.4 x 2.0 cm Mid:  1.8 x 1.9 cm Distal:  1.6 x 1.5 cm IMPRESSION: No evidence of abdominal aortic  aneurysm. Electronically Signed   By: Harmon Pier M.D.   On: 04/11/2023 16:37    Anti-infectives: Anti-infectives (From admission, onward)    Start     Dose/Rate Route Frequency Ordered Stop   04/13/23 1600  cefTRIAXone (ROCEPHIN) 1 g in sodium chloride 0.9 % 100 mL IVPB        1 g 200 mL/hr over 30 Minutes Intravenous Every 24 hours 04/13/23 0827     04/12/23 1700  cefTRIAXone (ROCEPHIN) 1 g in sodium chloride 0.9 % 100 mL IVPB        1 g 200 mL/hr over 30 Minutes Intravenous  Once 04/12/23 1657 04/12/23 1832       Assessment/Plan:  Keep current foley this admission and at discharge. We will request outpatient trial of void in few weeks. Continue finasteride + tamsulosin at discharge, these were added to DC meds.   He will need PSA and cysto in outpatient setting.   Please call with questions.    Loletta Parish. 04/13/2023

## 2023-04-13 NOTE — ED Notes (Signed)
ED TO INPATIENT HANDOFF REPORT  ED Nurse Name and Phone #: [phone]  S Name/Age/Gender William Bradshaw 67 y.o. male Room/Bed: 017C/017C  Code Status   Code Status: Full Code  Home/SNF/Other Home Patient oriented to: self, place, time, and situation Is this baseline? Yes   Triage Complete: Triage complete  Chief Complaint Urinary retention [R33.9]  Triage Note Pt reports feeling lightheaded and having generalized weakness, losing weight, decreased appetite, current smoker for the last trwo weeks. Seen by PCP this week for a checkup, was called by office and told to come to the ED because of abnormal labs. Pt awake, alert, appropriate. VSS.     Allergies No Known Allergies  Level of Care/Admitting Diagnosis ED Disposition     ED Disposition  Admit   Condition  --   Comment  Hospital Area: [facility] [[id number]]  Level of Care: Med-Surg [16]  May admit patient to [facility] or [facility] if equivalent level of care is available:: Yes  Covid Evaluation: Asymptomatic - no recent exposure (last 10 days) testing not required  Diagnosis: Urinary retention [[id number]]  Admitting Physician: Sunnie Nielsen [[id number]]  Attending Physician: Sunnie Nielsen [phone]  Certification:: I certify this patient will need inpatient services for at least 2 midnights  Expected Medical Readiness: 04/14/2023          B Medical/Surgery History Past Medical History:  Diagnosis Date   Hypertension    No past surgical history on file.   A IV Location/Drains/Wounds Patient Lines/Drains/Airways Status     Active Line/Drains/Airways     Name Placement date Placement time Site Days   Peripheral IV 04/12/23 20 G Left Antecubital 04/12/23  1716  Antecubital  1   Urethral Catheter Dr. Cathren Harsh Latex 18 Fr. 04/12/23  2120  Latex  1            Intake/Output Last 24 hours No intake or output data in the 24 hours ending 04/13/23 0012  Labs/Imaging Results  for orders placed or performed during the hospital encounter of 04/12/23 (from the past 48 hour(s))  Basic metabolic panel     Status: Abnormal   Collection Time: 04/12/23  2:21 PM  Result Value Ref Range   Sodium 138 135 - 145 mmol/L   Potassium 5.6 (H) 3.5 - 5.1 mmol/L   Chloride 108 98 - 111 mmol/L   CO2 17 (L) 22 - 32 mmol/L   Glucose, Bld 98 70 - 99 mg/dL    Comment: Glucose reference range applies only to samples taken after fasting for at least 8 hours.   BUN 43 (H) 8 - 23 mg/dL   Creatinine, Ser 8.41 (H) 0.61 - 1.24 mg/dL   Calcium 9.2 8.9 - 32.4 mg/dL   GFR, Estimated 17 (L) >60 mL/min    Comment: (NOTE) Calculated using the CKD-EPI Creatinine Equation (2021)    Anion gap 13 5 - 15    Comment: Performed at [facility], [address] [place], [place] [address]  CBC     Status: Abnormal   Collection Time: 04/12/23  2:21 PM  Result Value Ref Range   WBC 8.6 4.0 - 10.5 K/uL   RBC 3.25 (L) 4.22 - 5.81 MIL/uL   Hemoglobin 10.1 (L) 13.0 - 17.0 g/dL   HCT 72.5 (L) 36.6 - 44.0 %   MCV 97.5 80.0 - 100.0 fL   MCH 31.1 26.0 - 34.0 pg   MCHC 31.9 30.0 - 36.0 g/dL   RDW  12.6 11.5 - 15.5 %   Platelets 451 (H) 150 - 400 K/uL   nRBC 0.0 0.0 - 0.2 %    Comment: Performed at [facility], [address] [place], [place] [address]  Hepatic function panel     Status: Abnormal   Collection Time: 04/12/23  2:21 PM  Result Value Ref Range   Total Protein 8.2 (H) 6.5 - 8.1 g/dL   Albumin 3.2 (L) 3.5 - 5.0 g/dL   AST 17 15 - 41 U/L   ALT 15 0 - 44 U/L   Alkaline Phosphatase 65 38 - 126 U/L   Total Bilirubin 0.4 0.3 - 1.2 mg/dL   Bilirubin, Direct <5.2 0.0 - 0.2 mg/dL   Indirect Bilirubin NOT CALCULATED 0.3 - 0.9 mg/dL    Comment: Performed at [facility], [address], [place], [place] [address]  Urinalysis, Routine w reflex microscopic -Urine, Clean Catch     Status: Abnormal   Collection Time: 04/12/23  3:02 PM  Result Value Ref Range   Color, Urine AMBER (A)  YELLOW    Comment: BIOCHEMICALS MAY BE AFFECTED BY COLOR   APPearance TURBID (A) CLEAR   Specific Gravity, Urine 1.010 1.005 - 1.030   pH 5.0 5.0 - 8.0   Glucose, UA NEGATIVE NEGATIVE mg/dL   Hgb urine dipstick SMALL (A) NEGATIVE   Bilirubin Urine NEGATIVE NEGATIVE   Ketones, ur NEGATIVE NEGATIVE mg/dL   Protein, ur 440 (A) NEGATIVE mg/dL   Nitrite NEGATIVE NEGATIVE   Leukocytes,Ua LARGE (A) NEGATIVE   RBC / HPF 11-20 0 - 5 RBC/hpf   WBC, UA >50 0 - 5 WBC/hpf   Bacteria, UA RARE (A) NONE SEEN   Squamous Epithelial / HPF 0-5 0 - 5 /HPF   WBC Clumps PRESENT     Comment: Performed at [facility], [address], [place], [place] [address]  I-Stat CG4 Lactic Acid     Status: None   Collection Time: 04/12/23  5:46 PM  Result Value Ref Range   Lactic Acid, Venous 1.3 0.5 - 1.9 mmol/L  Urine rapid drug screen (hosp performed)     Status: Abnormal   Collection Time: 04/12/23  7:51 PM  Result Value Ref Range   Opiates NONE DETECTED NONE DETECTED   Cocaine POSITIVE (A) NONE DETECTED   Benzodiazepines NONE DETECTED NONE DETECTED   Amphetamines NONE DETECTED NONE DETECTED   Tetrahydrocannabinol NONE DETECTED NONE DETECTED   Barbiturates NONE DETECTED NONE DETECTED    Comment: (NOTE) DRUG SCREEN FOR MEDICAL PURPOSES ONLY.  IF CONFIRMATION IS NEEDED FOR ANY PURPOSE, NOTIFY LAB WITHIN 5 DAYS.  LOWEST DETECTABLE LIMITS FOR URINE DRUG SCREEN Drug Class                     Cutoff (ng/mL) Amphetamine and metabolites    1000 Barbiturate and metabolites    200 Benzodiazepine                 200 Opiates and metabolites        300 Cocaine and metabolites        300 THC                            50 Performed at [facility], [address] [place], [place] [address]   I-Stat CG4 Lactic Acid     Status: None   Collection Time: 04/12/23  8:59  PM  Result Value Ref Range   Lactic Acid, Venous 1.2 0.5 - 1.9 mmol/L   CT ABDOMEN PELVIS WO CONTRAST  Result Date:  04/12/2023 CLINICAL DATA:  Abdominal pain, acute, nonlocalized. EXAM: CT ABDOMEN AND PELVIS WITHOUT CONTRAST TECHNIQUE: Multidetector CT imaging of the abdomen and pelvis was performed following the standard protocol without IV contrast. RADIATION DOSE REDUCTION: This exam was performed according to the departmental dose-optimization program which includes automated exposure control, adjustment of the mA and/or kV according to patient size and/or use of iterative reconstruction technique. COMPARISON:  Ultrasound of the abdominal aorta 04/11/2023. FINDINGS: Lower chest: The lung bases are clear without focal nodule, mass, or airspace disease. Heart size is normal. Hepatobiliary: No focal liver abnormality is seen. No gallstones, gallbladder wall thickening, or biliary dilatation. Pancreas: Unremarkable. No pancreatic ductal dilatation or surrounding inflammatory changes. Spleen: Normal in size without focal abnormality. Adrenals/Urinary Tract: Adrenal glands are normal bilaterally. Moderate to severe right and moderate left hydronephrosis is present. The ureters are dilated to the level of the UVJ. No stone or mass lesion is present in either kidney. Parenchymal density is more hypodense on the right. The urinary bladder is distended to the scratched at the urinary bladder is distended up to 12.7 cm in cephalo caudad dimension. Extensive irregular wall thickening is present throughout the urinary bladder. Stomach/Bowel: Stomach and duodenum are within normal limits. Small bowel is unremarkable. Terminal ileum is within normal limits. The ascending and transverse colon are within normal limits. The descending and sigmoid colon are normal. Vascular/Lymphatic: Atherosclerotic calcifications are present in the aorta and branch vessels. No aneurysm is present. No significant adenopathy is present. Reproductive: The prostate gland is mildly enlarged, measuring up to 3.5 cm. Other: No abdominal wall hernia or abnormality.  No abdominopelvic ascites. Musculoskeletal: Grade 1 degenerative anterolisthesis at L3-4 measures 7 mm secondary to bilateral L3 pars defects. Chronic endplate sclerotic changes are present at L3-4. Focal osseous lesions are present. Bony pelvis is within normal limits. The hips are located and normal bilaterally. IMPRESSION: 1. Moderate to severe right and moderate left hydronephrosis with dilation of the ureters to the level of the UVJ. 2. The urinary bladder is distended up to 12.7 cm in cephalo caudad dimension. Extensive irregular wall thickening is present throughout the urinary bladder. This is consistent with severe bladder outlet obstruction. Neoplasm is not excluded. Recommend Urology consultation. 3. Mildly enlarged prostate gland. 4. Grade 1 degenerative anterolisthesis at L3-4 secondary to bilateral L3 pars defects. 5.  Aortic Atherosclerosis (ICD10-I70.0). These results were called by telephone at the time of interpretation on 04/12/2023 at 7:31 pm to provider [facility] , who verbally acknowledged these results. Electronically Signed   By: Marin Roberts M.D.   On: 04/12/2023 19:37   DG Chest 2 View  Result Date: 04/12/2023 CLINICAL DATA:  Cough.  Patient's lightheaded and feeling weak. EXAM: CHEST - 2 VIEW COMPARISON:  Two-view chest x-ray 12/29/2021 FINDINGS: The heart size and mediastinal contours are within normal limits. Both lungs are clear. The visualized skeletal structures are unremarkable. IMPRESSION: Negative two view chest x-ray Electronically Signed   By: Marin Roberts M.D.   On: 04/12/2023 19:18   US AORTA MEDICARE SCREENING  Result Date: 04/11/2023 CLINICAL DATA:  Male between 48-73 years of age with a smoking history. EXAM: US ABDOMINAL AORTA MEDICARE SCREENING TECHNIQUE: Ultrasound examination of the abdominal aorta was performed as a screening evaluation for abdominal aortic aneurysm. COMPARISON:  None Available. FINDINGS: Abdominal aortic measurements as  follows: Proximal:  2.4 x 2.0 cm Mid:  1.8 x 1.9 cm Distal:  1.6 x 1.5 cm IMPRESSION: No evidence of abdominal aortic aneurysm. Electronically Signed   By: Harmon Pier M.D.   On: 04/11/2023 16:37    Pending Labs Unresulted Labs (From admission, onward)     Start     Ordered   04/13/23 0500  Basic metabolic panel  Tomorrow morning,   R        04/12/23 2326   04/13/23 0500  CBC  Tomorrow morning,   R        04/12/23 2326   04/12/23 2326  HIV Antibody (routine testing w rflx)  (HIV Antibody (Routine testing w reflex) panel)  Once,   R        04/12/23 2326   04/12/23 2326  CBC  (heparin)  Once,   R       Comments: Baseline for heparin therapy IF NOT ALREADY DRAWN.  Notify MD if PLT < 100 K.    04/12/23 2326   04/12/23 2326  Creatinine, serum  (heparin)  Once,   R       Comments: Baseline for heparin therapy IF NOT ALREADY DRAWN.    04/12/23 2326   04/12/23 1830  Hepatic function panel  Once,   STAT        04/12/23 1830   04/12/23 1658  Culture, blood (routine x 2)  BLOOD CULTURE X 2,   R      04/12/23 1657   04/12/23 1657  Urine Culture  Once,   URGENT       Question:  Indication  Answer:  Dysuria   04/12/23 1657            Vitals/Pain Today's Vitals   04/12/23 1815 04/12/23 1913 04/12/23 2215 04/12/23 2231  BP: (!) 139/91  (!) 144/86   Pulse: 64  (!) 59   Resp: (!) 25  19   Temp:    98.4 F (36.9 C)  TempSrc:    Oral  SpO2: 100%  (!) 61%   Weight:      Height:      PainSc:  0-No pain      Isolation Precautions No active isolations  Medications Medications  oxybutynin (DITROPAN) tablet 5 mg (5 mg Oral Given 04/12/23 2205)  tamsulosin (FLOMAX) capsule 0.4 mg (0.4 mg Oral Given 04/12/23 2205)  heparin injection 5,000 Units (has no administration in time range)  acetaminophen (TYLENOL) tablet 650 mg (has no administration in time range)    Or  acetaminophen (TYLENOL) suppository 650 mg (has no administration in time range)  oxyCODONE (Oxy IR/ROXICODONE) immediate  release tablet 5 mg (has no administration in time range)  ondansetron (ZOFRAN) tablet 4 mg (has no administration in time range)    Or  ondansetron (ZOFRAN) injection 4 mg (has no administration in time range)  polyethylene glycol (MIRALAX / GLYCOLAX) packet 17 g (has no administration in time range)  NIFEdipine (PROCARDIA-XL/NIFEDICAL-XL) 24 hr tablet 30 mg (has no administration in time range)  sodium chloride 0.9 % bolus 1,000 mL (0 mLs Intravenous Stopped 04/12/23 1916)  sodium zirconium cyclosilicate (LOKELMA) packet 5 g (5 g Oral Given 04/12/23 1817)  cefTRIAXone (ROCEPHIN) 1 g in sodium chloride 0.9 % 100 mL IVPB (0 g Intravenous Stopped 04/12/23 1832)    Mobility walks     Focused Assessments Renal Assessment Handoff:  Hemodialysis Schedule:  Last Hemodialysis date and time:    Restricted appendage:  R Recommendations: See Admitting Provider Note  Report given to:   Additional Notes:  Patient is A&O X4 walks with standby assist due to recent weakness

## 2023-04-14 DIAGNOSIS — R339 Retention of urine, unspecified: Secondary | ICD-10-CM | POA: Diagnosis not present

## 2023-04-14 LAB — BASIC METABOLIC PANEL
Anion gap: 8 (ref 5–15)
BUN: 19 mg/dL (ref 8–23)
CO2: 22 mmol/L (ref 22–32)
Calcium: 8.2 mg/dL — ABNORMAL LOW (ref 8.9–10.3)
Chloride: 107 mmol/L (ref 98–111)
Creatinine, Ser: 2.36 mg/dL — ABNORMAL HIGH (ref 0.61–1.24)
GFR, Estimated: 29 mL/min — ABNORMAL LOW (ref >60)
Glucose, Bld: 191 mg/dL — ABNORMAL HIGH (ref 70–99)
Potassium: 4 mmol/L (ref 3.5–5.1)
Sodium: 137 mmol/L (ref 135–145)

## 2023-04-14 LAB — CBC WITH DIFFERENTIAL/PLATELET
Abs Immature Granulocytes: 0.06 10*3/uL (ref 0.00–0.07)
Basophils Absolute: 0.1 10*3/uL (ref 0.0–0.1)
Basophils Relative: 1 %
Eosinophils Absolute: 0.3 10*3/uL (ref 0.0–0.5)
Eosinophils Relative: 3 %
HCT: 27.8 % — ABNORMAL LOW (ref 39.0–52.0)
Hemoglobin: 9 g/dL — ABNORMAL LOW (ref 13.0–17.0)
Immature Granulocytes: 1 %
Lymphocytes Relative: 18 %
Lymphs Abs: 1.5 10*3/uL (ref 0.7–4.0)
MCH: 31.5 pg (ref 26.0–34.0)
MCHC: 32.4 g/dL (ref 30.0–36.0)
MCV: 97.2 fL (ref 80.0–100.0)
Monocytes Absolute: 0.6 10*3/uL (ref 0.1–1.0)
Monocytes Relative: 7 %
Neutro Abs: 5.9 10*3/uL (ref 1.7–7.7)
Neutrophils Relative %: 70 %
Platelets: 387 10*3/uL (ref 150–400)
RBC: 2.86 MIL/uL — ABNORMAL LOW (ref 4.22–5.81)
RDW: 12.6 % (ref 11.5–15.5)
WBC: 8.4 10*3/uL (ref 4.0–10.5)
nRBC: 0 % (ref 0.0–0.2)

## 2023-04-14 MED ORDER — SODIUM CHLORIDE 0.9 % IV SOLN: INTRAVENOUS | Status: DC

## 2023-04-14 MED ORDER — SODIUM CHLORIDE 0.9 % IV SOLN: INTRAVENOUS | Status: AC

## 2023-04-14 NOTE — Plan of Care (Signed)

## 2023-04-14 NOTE — Progress Notes (Signed)
PROGRESS NOTE  William Bradshaw:[id number] DOB: 04-11-1956 DOA: 04/12/2023 PCP: Ellyn Hack, MD  HPI/Recap of past 24 hours: William Bradshaw is a 67 y.o. male w/ PMH hypertension, presents to ED 04/12/2023 with chief complaint of difficulty emptying his bladder over the past several months, describing weak stream, straining to urinate, urgency/frequency as well as abnormal labs from his PCP. Also reports weight loss and poor appetite with weakness. In ED, UA concerning for UTI, urinary retention requiring Foley catheter placement by urology, mild hyperkalemia 5.6 without EKG changes, AKI on uncertain baseline kidney function with creatinine 3.78 and GFR 17. CT a/p demonstrated moderate to severe right and left hydronephrosis, hydroureter, concern for bladder outlet obstruction and bladder wall thickening.  EDP consulted urology. Hospitalist consulted for admission.    Still reporting episodes of bladder spasm, oxybutynin seems to help.  Foley working, urine output optimal.    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 Urinary bladder is distended up to 12.7 cm in cephalo caudad dimension. Extensive irregular wall thickening is present throughout the urinary bladder. Neoplasm cannot be ruled out Prostate mildly enlarged Difficult Foley placement (done by urology)- d/c with foley for outpt voiding trial Flomax, finasteride Oxybutynin as needed for bladder spasms Urology on board, outpt follow up for cysto and PSA  Anemia of chronic disease Hgb  around baseline Daily CBC  Possible UTI Afebrile with mild leukocytosis UA with large leukocytes, rare bacteria, >50 WBC UC growing >100,000 group B strep agalactiae  BC X 2 pending Continue ceftriaxone for now   Hypertension BP stable Continue nifedipine  Tobacco and cocaine abuse Extensive counseling, advised to quit Refused nicotine patch    Estimated body mass index is 17.28 kg/m as calculated from the following:   Height as of this encounter: 5\' 7"  (1.702 m).   Weight as of this encounter: 50 kg.     Code Status: Full  Family Communication: None at bedside  Disposition Plan: Status is: Inpatient Remains inpatient appropriate because: level of care      Consultants: Urology  Procedures: None  Antimicrobials: Ceftriaxone  DVT prophylaxis:  heparin   Objective: Vitals:   04/13/23 1621 04/13/23 1944 04/14/23 0341 04/14/23 0816  BP: 122/73 126/71 117/80 (!) 151/92  Pulse: 90 85 87 88  Resp: 18 16 16 18   Temp: 98.2 F (36.8 C) 98.3 F (36.8 C) 98.8 F (37.1 C) 98.5 F (36.9 C)  TempSrc:  Oral  Oral  SpO2: 100% 100% 100% 99%  Weight:      Height:        Intake/Output Summary (Last 24 hours) at 04/14/2023 1540 Last data filed at 04/14/2023 0825 Gross per 24 hour  Intake 2524.44 ml  Output 2375 ml  Net 149.44 ml   Filed Weights   04/12/23 1407 04/13/23 0128  Weight: 53.1 kg 50 kg    Exam: General: NAD, thin Cardiovascular: S1, S2 present Respiratory: CTAB Abdomen: Soft, nontender, nondistended, bowel sounds present Musculoskeletal: No bilateral pedal edema noted Skin: Normal Psychiatry: Normal mood    Data Reviewed: CBC: Recent Labs  Lab 04/12/23 1421 04/13/23 0136 04/13/23 0325 04/14/23 0838  WBC 8.6 8.4 11.5* 8.4  NEUTROABS  --   --   --  5.9  HGB 10.1* 10.0* 10.2* 9.0*  HCT 31.7* 31.2* 31.5* 27.8*  MCV 97.5 96.6 95.2 97.2  PLT 451* 440* 452* 387   Basic Metabolic Panel: Recent Labs  Lab 04/12/23 1421  04/13/23 0136 04/13/23 0325 04/14/23 0838  NA 138  --  137 137  K 5.6*  --  4.7 4.0  CL 108  --  107 107  CO2 17*  --  21* 22  GLUCOSE 98  --  224* 191*  BUN 43*  --  36* 19  CREATININE 3.78* 3.52* 3.40* 2.36*  CALCIUM 9.2  --  8.9 8.2*   GFR: Estimated Creatinine Clearance: 20.9 mL/min (A) (by C-G formula based on SCr of 2.36 mg/dL (H)). Liver Function Tests: Recent Labs  Lab 04/12/23 1421 04/13/23 0136  AST 17 16  ALT 15 16  ALKPHOS 65 71  BILITOT 0.4 <0.1*  PROT 8.2* 8.1  ALBUMIN 3.2* 3.1*   No results for input(s): "LIPASE", "AMYLASE" in the last 168 hours. No results for input(s): "AMMONIA" in the last 168 hours. Coagulation Profile: No results for input(s): "INR", "PROTIME" in the last 168 hours. Cardiac Enzymes: No results for input(s): "CKTOTAL", "CKMB", "CKMBINDEX", "TROPONINI" in the last 168 hours. BNP (last 3 results) No results for input(s): "PROBNP" in the last 8760 hours. HbA1C: No results for input(s): "HGBA1C" in the last 72 hours. CBG: No results for input(s): "GLUCAP" in the last 168 hours. Lipid Profile: No results for input(s): "CHOL", "HDL", "LDLCALC", "TRIG", "CHOLHDL", "LDLDIRECT" in the last 72 hours. Thyroid Function Tests: No results for input(s): "TSH", "T4TOTAL", "FREET4", "T3FREE", "THYROIDAB" in the last 72 hours. Anemia Panel: No results for input(s): "VITAMINB12", "FOLATE", "FERRITIN", "TIBC", "IRON", "RETICCTPCT" in the last 72 hours. Urine analysis:    Component Value Date/Time   COLORURINE AMBER (A) 04/12/2023 1502   APPEARANCEUR TURBID (A) 04/12/2023 1502   LABSPEC 1.010 04/12/2023 1502   PHURINE 5.0 04/12/2023 1502   GLUCOSEU NEGATIVE 04/12/2023 1502   HGBUR SMALL (A) 04/12/2023 1502   BILIRUBINUR NEGATIVE 04/12/2023 1502   KETONESUR NEGATIVE 04/12/2023 1502   PROTEINUR 100 (A) 04/12/2023 1502   UROBILINOGEN 0.2 04/01/2010 1306   NITRITE NEGATIVE 04/12/2023 1502   LEUKOCYTESUR LARGE (A) 04/12/2023 1502   Sepsis  Labs: @LABRCNTIP (procalcitonin:4,lacticidven:4)  ) Recent Results (from the past 240 hour(s))  Urine Culture     Status: Abnormal   Collection Time: 04/12/23  4:57 PM   Specimen: Urine, Clean Catch  Result Value Ref Range Status   Specimen Description URINE, CLEAN CATCH  Final   Special Requests NONE  Final   Culture (A)  Final    >=100,000 COLONIES/mL GROUP B STREP(S.AGALACTIAE)ISOLATED TESTING AGAINST S. AGALACTIAE NOT ROUTINELY PERFORMED DUE TO PREDICTABILITY OF AMP/PEN/VAN SUSCEPTIBILITY. Performed at [facility], [address], [place], [place] [address]    Report Status 04/13/2023 FINAL  Final  Culture, blood (routine x 2)     Status: None (Preliminary result)   Collection Time: 04/12/23  5:39 PM   Specimen: BLOOD  Result Value Ref Range Status   Specimen Description BLOOD LEFT ANTECUBITAL  Final   Special Requests   Final    AEROBIC BOTTLE ONLY Blood Culture results may not be optimal due to an inadequate volume of blood received in culture bottles   Culture   Final    NO GROWTH  2 DAYS Performed at [facility], [address], [place], [place] [address]    Report Status PENDING  Incomplete      Studies: No results found.  Scheduled Meds:  Chlorhexidine Gluconate Cloth  6 each Topical Daily   feeding supplement  237 mL Oral BID BM   finasteride  5 mg Oral Daily   heparin  5,000 Units Subcutaneous Q8H   NIFEdipine  30 mg Oral Daily   tamsulosin  0.4 mg Oral Daily    Continuous Infusions:  cefTRIAXone (ROCEPHIN)  IV 1 g (04/14/23 1518)     LOS: 2 days     Briant Cedar, MD [facility]  If 7PM-7AM, please contact night-coverage [email or web address] 04/14/2023, 3:40 PM

## 2023-04-14 NOTE — Plan of Care (Signed)

## 2023-04-15 DIAGNOSIS — R339 Retention of urine, unspecified: Secondary | ICD-10-CM | POA: Diagnosis not present

## 2023-04-15 LAB — BASIC METABOLIC PANEL
Anion gap: 9 (ref 5–15)
BUN: 16 mg/dL (ref 8–23)
CO2: 20 mmol/L — ABNORMAL LOW (ref 22–32)
Calcium: 8.7 mg/dL — ABNORMAL LOW (ref 8.9–10.3)
Chloride: 110 mmol/L (ref 98–111)
Creatinine, Ser: 2.43 mg/dL — ABNORMAL HIGH (ref 0.61–1.24)
GFR, Estimated: 28 mL/min — ABNORMAL LOW (ref >60)
Glucose, Bld: 111 mg/dL — ABNORMAL HIGH (ref 70–99)
Potassium: 4.5 mmol/L (ref 3.5–5.1)
Sodium: 139 mmol/L (ref 135–145)

## 2023-04-15 LAB — GLUCOSE, CAPILLARY: Glucose-Capillary: 107 mg/dL — ABNORMAL HIGH (ref 70–99)

## 2023-04-15 MED ORDER — SODIUM CHLORIDE 0.9 % IV SOLN: INTRAVENOUS | Status: AC

## 2023-04-15 NOTE — Progress Notes (Signed)
PROGRESS NOTE  William Bradshaw YQI:[id number] DOB: Apr 05, 1956 DOA: 04/12/2023 PCP: Ellyn Hack, MD  HPI/Recap of past 24 hours: William Bradshaw is a 67 y.o. male w/ PMH hypertension, presents to ED 04/12/2023 with chief complaint of difficulty emptying his bladder over the past several months, describing weak stream, straining to urinate, urgency/frequency as well as abnormal labs from his PCP. Also reports weight loss and poor appetite with weakness. In ED, UA concerning for UTI, urinary retention requiring Foley catheter placement by urology, mild hyperkalemia 5.6 without EKG changes, AKI on uncertain baseline kidney function with creatinine 3.78 and GFR 17. CT a/p demonstrated moderate to severe right and left hydronephrosis, hydroureter, concern for bladder outlet obstruction and bladder wall thickening.  EDP consulted urology. Hospitalist consulted for admission.    Reports bladder spasm is improving, but avoiding to eat to prevent pain. Educated pt on the need to eat and stay hydrated given the IVF shortage, verbalized understanding. 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 Urinary bladder is distended up to 12.7 cm in cephalo caudad dimension. Extensive irregular wall thickening is present throughout the urinary bladder. Neoplasm cannot be ruled out Prostate mildly enlarged Difficult Foley placement (done by urology)- d/c with foley for outpt voiding trial Flomax, finasteride Oxybutynin as needed for  bladder spasms Urology on board, outpt follow up for cysto and PSA  Anemia of chronic disease Hgb around baseline Daily CBC  Possible UTI Afebrile with mild leukocytosis UA with large leukocytes, rare bacteria, >50 WBC UC growing >100,000 group B strep agalactiae  BC X 2 NGTD Continue ceftriaxone for now   Hypertension BP stable Continue nifedipine  Tobacco and cocaine abuse Extensive counseling, advised to quit Refused nicotine patch    Estimated body mass index is 17.28 kg/m as calculated from the following:   Height as of this encounter: 5\' 7"  (1.702 m).   Weight as of this encounter: 50 kg.     Code Status: Full  Family Communication: None at bedside  Disposition Plan: Status is: Inpatient Remains inpatient appropriate because: level of care      Consultants: Urology  Procedures: None  Antimicrobials: Ceftriaxone  DVT prophylaxis:  heparin   Objective: Vitals:   04/14/23 1627 04/14/23 2006 04/15/23 0317 04/15/23 0958  BP: 128/74 130/69 137/78 120/81  Pulse: 68 84 77 83  Resp: 18 20 16 20   Temp: 98 F (36.7 C) 99.4 F (37.4 C) 98 F (36.7 C) 98 F (36.7 C)  TempSrc:      SpO2: 100% 100% 100% 100%  Weight:      Height:        Intake/Output Summary (Last 24 hours) at 04/15/2023 1320 Last data filed at 04/15/2023 1306 Gross per 24 hour  Intake 240 ml  Output 4650 ml  Net -4410 ml   Filed Weights   04/12/23 1407 04/13/23 0128  Weight: 53.1 kg 50 kg    Exam: General: NAD, thin Cardiovascular: S1, S2 present Respiratory: CTAB Abdomen: Soft, nontender, nondistended, bowel sounds present Musculoskeletal: No bilateral  pedal edema noted Skin: Normal Psychiatry: Normal mood    Data Reviewed: CBC: Recent Labs  Lab 04/12/23 1421 04/13/23 0136 04/13/23 0325 04/14/23 0838  WBC 8.6 8.4 11.5* 8.4  NEUTROABS  --   --   --  5.9  HGB 10.1* 10.0* 10.2* 9.0*  HCT 31.7* 31.2* 31.5* 27.8*  MCV 97.5 96.6 95.2 97.2  PLT 451* 440* 452*  387   Basic Metabolic Panel: Recent Labs  Lab 04/12/23 1421 04/13/23 0136 04/13/23 0325 04/14/23 0838 04/15/23 0542  NA 138  --  137 137 139  K 5.6*  --  4.7 4.0 4.5  CL 108  --  107 107 110  CO2 17*  --  21* 22 20*  GLUCOSE 98  --  224* 191* 111*  BUN 43*  --  36* 19 16  CREATININE 3.78* 3.52* 3.40* 2.36* 2.43*  CALCIUM 9.2  --  8.9 8.2* 8.7*   GFR: Estimated Creatinine Clearance: 20.3 mL/min (A) (by C-G formula based on SCr of 2.43 mg/dL (H)). Liver Function Tests: Recent Labs  Lab 04/12/23 1421 04/13/23 0136  AST 17 16  ALT 15 16  ALKPHOS 65 71  BILITOT 0.4 <0.1*  PROT 8.2* 8.1  ALBUMIN 3.2* 3.1*   No results for input(s): "LIPASE", "AMYLASE" in the last 168 hours. No results for input(s): "AMMONIA" in the last 168 hours. Coagulation Profile: No results for input(s): "INR", "PROTIME" in the last 168 hours. Cardiac Enzymes: No results for input(s): "CKTOTAL", "CKMB", "CKMBINDEX", "TROPONINI" in the last 168 hours. BNP (last 3 results) No results for input(s): "PROBNP" in the last 8760 hours. HbA1C: No results for input(s): "HGBA1C" in the last 72 hours. CBG: No results for input(s): "GLUCAP" in the last 168 hours. Lipid Profile: No results for input(s): "CHOL", "HDL", "LDLCALC", "TRIG", "CHOLHDL", "LDLDIRECT" in the last 72 hours. Thyroid Function Tests: No results for input(s): "TSH", "T4TOTAL", "FREET4", "T3FREE", "THYROIDAB" in the last 72 hours. Anemia Panel: No results for input(s): "VITAMINB12", "FOLATE", "FERRITIN", "TIBC", "IRON", "RETICCTPCT" in the last 72 hours. Urine analysis:    Component Value Date/Time   COLORURINE AMBER (A) 04/12/2023 1502   APPEARANCEUR TURBID (A) 04/12/2023 1502   LABSPEC 1.010 04/12/2023 1502   PHURINE 5.0 04/12/2023 1502   GLUCOSEU NEGATIVE 04/12/2023 1502   HGBUR SMALL (A) 04/12/2023 1502   BILIRUBINUR NEGATIVE 04/12/2023 1502   KETONESUR NEGATIVE 04/12/2023 1502   PROTEINUR 100 (A) 04/12/2023 1502   UROBILINOGEN  0.2 04/01/2010 1306   NITRITE NEGATIVE 04/12/2023 1502   LEUKOCYTESUR LARGE (A) 04/12/2023 1502   Sepsis Labs: @LABRCNTIP (procalcitonin:4,lacticidven:4)  ) Recent Results (from the past 240 hour(s))  Urine Culture     Status: Abnormal   Collection Time: 04/12/23  4:57 PM   Specimen: Urine, Clean Catch  Result Value Ref Range Status   Specimen Description URINE, CLEAN CATCH  Final   Special Requests NONE  Final   Culture (A)  Final    >=100,000 COLONIES/mL GROUP B STREP(S.AGALACTIAE)ISOLATED TESTING AGAINST S. AGALACTIAE NOT ROUTINELY PERFORMED DUE TO PREDICTABILITY OF AMP/PEN/VAN SUSCEPTIBILITY. Performed at [facility], [address] [place], [place] [address]    Report Status 04/13/2023 FINAL  Final  Culture, blood (routine x 2)     Status: None (Preliminary result)   Collection Time: 04/12/23  5:39 PM   Specimen: BLOOD  Result Value Ref Range Status   Specimen Description BLOOD LEFT ANTECUBITAL  Final   Special Requests   Final    AEROBIC BOTTLE ONLY Blood Culture  results may not be optimal due to an inadequate volume of blood received in culture bottles   Culture   Final    NO GROWTH 3 DAYS Performed at [facility], [address], [place], [place] [address]    Report Status PENDING  Incomplete      Studies: No results found.  Scheduled Meds:  Chlorhexidine Gluconate Cloth  6 each Topical Daily   feeding supplement  237 mL Oral BID BM   finasteride  5 mg Oral Daily   heparin  5,000 Units Subcutaneous Q8H   NIFEdipine  30 mg Oral Daily   tamsulosin  0.4 mg Oral Daily    Continuous Infusions:  sodium chloride 75 mL/hr at 04/15/23 0910   cefTRIAXone (ROCEPHIN)  IV 1 g (04/14/23 1518)     LOS: 3 days     Briant Cedar, MD [facility]  If 7PM-7AM, please contact night-coverage [email or web address] 04/15/2023, 1:20 PM

## 2023-04-15 NOTE — Care Management Important Message (Signed)
Important Message  Patient Details  Name: William Bradshaw MRN: [id number] Date of Birth: [birth year]   Important Message Given:  Yes - Medicare IM     Dorena Bodo 04/15/2023, 3:22 PM

## 2023-04-15 NOTE — Progress Notes (Signed)
   04/15/23 1551  TOC Brief Assessment  Insurance and Status Reviewed [employer])  Patient has primary care physician Yes Sherryll Burger, Melanee Spry, MD)  Home environment has been reviewed from home with Family  Prior level of function: independent  Prior/Current Home Services No current home services  Social Determinants of Health Reivew SDOH reviewed interventions complete (smoking cessation attached to DC paperwork)  Readmission risk has been reviewed Yes (12%)  Transition of care needs transition of care needs identified, TOC will continue to follow (Smoking cessation information attached to DC paperwork)   TOC will continue to follow patient for any additional discharge needs

## 2023-04-16 ENCOUNTER — Inpatient Hospital Stay (HOSPITAL_COMMUNITY): Payer: Medicare HMO

## 2023-04-16 DIAGNOSIS — E43 Unspecified severe protein-calorie malnutrition: Secondary | ICD-10-CM | POA: Insufficient documentation

## 2023-04-16 DIAGNOSIS — R339 Retention of urine, unspecified: Secondary | ICD-10-CM | POA: Diagnosis not present

## 2023-04-16 LAB — BASIC METABOLIC PANEL
Anion gap: 11 (ref 5–15)
BUN: 17 mg/dL (ref 8–23)
CO2: 24 mmol/L (ref 22–32)
Calcium: 9.2 mg/dL (ref 8.9–10.3)
Chloride: 103 mmol/L (ref 98–111)
Creatinine, Ser: 2.37 mg/dL — ABNORMAL HIGH (ref 0.61–1.24)
GFR, Estimated: 29 mL/min — ABNORMAL LOW (ref >60)
Glucose, Bld: 97 mg/dL (ref 70–99)
Potassium: 4.3 mmol/L (ref 3.5–5.1)
Sodium: 138 mmol/L (ref 135–145)

## 2023-04-16 LAB — GLUCOSE, CAPILLARY: Glucose-Capillary: 92 mg/dL (ref 70–99)

## 2023-04-16 NOTE — Plan of Care (Signed)
Problem: Health Behavior/Discharge Planning: Goal: Ability to manage health-related needs will improve Outcome: Completed/Met

## 2023-04-16 NOTE — Progress Notes (Signed)
Initial Nutrition Assessment  DOCUMENTATION CODES:   Severe malnutrition in context of acute illness/injury, Underweight  INTERVENTION:  Ensure BID, Snack in evening   NUTRITION DIAGNOSIS:   Severe Malnutrition related to acute illness as evidenced by severe fat depletion, severe muscle depletion.    GOAL:   Patient will meet greater than or equal to 90% of their needs    MONITOR:   PO intake, Supplement acceptance, Labs, Weight trends  REASON FOR ASSESSMENT:   Malnutrition Screening Tool    ASSESSMENT:  67 y.o. M admitted for urinary retention. chief complaint of difficulty emptying his bladder over the past several months, describing weak stream, straining to urinate, urgency/frequency as well as abnormal labs from his PCP. Also reports weight loss and poor appetite with weakness. Pmhx: HTN  Admit weight: 53.1 Current weight: 50     Average Meal Intake: 0-100: 67% intake x 8 recorded meals  Nutritionally Relevant Medications: Scheduled Meds:  feeding supplement  237 mL Oral BID BM   tamsulosin  0.4 mg Oral Daily    Labs Reviewed CBG ranges from 97-111 mg/dL over the last 24 hours     NUTRITION - FOCUSED PHYSICAL EXAM:  Flowsheet Row Most Recent Value  Orbital Region Severe depletion  Upper Arm Region Severe depletion  Thoracic and Lumbar Region Severe depletion  Buccal Region Severe depletion  Temple Region Severe depletion  Clavicle Bone Region Severe depletion  Clavicle and Acromion Bone Region Severe depletion  Scapular Bone Region Severe depletion  Dorsal Hand Severe depletion  Patellar Region Severe depletion  Anterior Thigh Region Severe depletion  Posterior Calf Region Severe depletion  Edema (RD Assessment) None  Hair Reviewed  Eyes Reviewed  Mouth Reviewed  Skin Reviewed  Nails Reviewed       Diet Order:   Diet Order             Diet regular Room service appropriate? Yes; Fluid consistency: Thin  Diet effective now                    EDUCATION NEEDS:   Education needs have been addressed  Skin:  Skin Assessment: Reviewed RN Assessment  Last BM:  PTA  Height:   Ht Readings from Last 1 Encounters:  04/13/23 5\' 7"  (1.702 m)    Weight:   Wt Readings from Last 1 Encounters:  04/13/23 50 kg    Ideal Body Weight:     BMI:  Body mass index is 17.28 kg/m.  Estimated Nutritional Needs:   Kcal:  1500-1750 kcal  Protein:  65-75 g  Fluid:  23ml/kcal    Jamelle Haring RDN, LDN Clinical Dietitian  RDN pager # available on Amion

## 2023-04-16 NOTE — Progress Notes (Signed)
PROGRESS NOTE  William Bradshaw NUU:[id number] DOB: [birth year] DOA: 04/12/2023 PCP: Ellyn Hack, MD  HPI/Recap of past 24 hours: William Bradshaw is a 67 y.o. male w/ PMH hypertension, presents to ED 04/12/2023 with chief complaint of difficulty emptying his bladder over the past several months, describing weak stream, straining to urinate, urgency/frequency as well as abnormal labs from his PCP. Also reports weight loss and poor appetite with weakness. In ED, UA concerning for UTI, urinary retention requiring Foley catheter placement by urology, mild hyperkalemia 5.6 without EKG changes, AKI on uncertain baseline kidney function with creatinine 3.78 and GFR 17. CT a/p demonstrated moderate to severe right and left hydronephrosis, hydroureter, concern for bladder outlet obstruction and bladder wall thickening.  EDP consulted urology. Hospitalist consulted for admission.    Still with intermittent significant pain/bladder spasm. Tender suprapubic region. Appears uncomfortable. 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 Urinary bladder is distended up to 12.7 cm in cephalo caudad dimension. Extensive irregular wall thickening is present throughout the urinary bladder. Neoplasm cannot be ruled out Repeat CT renal pending Prostate only mildly enlarged Difficult Foley placement (done by urology)- d/c with foley for outpt voiding trial Flomax, finasteride Oxybutynin as needed for bladder spasms Urology consulted, outpt  follow up for cysto and PSA  Anemia of chronic disease Hgb around baseline Daily CBC  GBS UTI Afebrile with mild leukocytosis UA with large leukocytes, rare bacteria, >50 WBC UC growing >100,000 group B strep agalactiae  BC X 2 NGTD Continue ceftriaxone for now   Hypertension BP stable Continue nifedipine  Tobacco and cocaine abuse Extensive counseling, advised to quit Refused nicotine patch    Estimated body mass index is 17.28 kg/m as calculated from the following:   Height as of this encounter: 5\' 7"  (1.702 m).   Weight as of this encounter: 50 kg.     Code Status: Full  Family Communication: None at bedside  Disposition Plan: Status is: Inpatient Remains inpatient appropriate because: level of care      Consultants: Urology  Procedures: None  Antimicrobials: Ceftriaxone  DVT prophylaxis:  heparin   Objective: Vitals:   04/15/23 1720 04/15/23 2126 04/16/23 0519 04/16/23 0829  BP: (!) 143/82 (!) 143/75 130/77 134/82  Pulse: 100 80 84 87  Resp: 19 19 19 18   Temp: 98.3 F (36.8 C) 98.5 F (36.9 C) 98.6 F (37 C) 98.5 F (36.9 C)  TempSrc:    Oral  SpO2: 100% 100% 99% 100%  Weight:      Height:        Intake/Output Summary (Last 24 hours) at 04/16/2023 1422 Last data filed at 04/16/2023 1241 Gross per 24 hour  Intake 722.4 ml  Output 7575 ml  Net -6852.6 ml   Filed Weights   04/12/23 1407 04/13/23 0128  Weight: 53.1 kg 50 kg    Exam: General: NAD, thin Cardiovascular: S1, S2 present Respiratory: CTAB Abdomen: Soft, tender, nondistended, bowel sounds present Musculoskeletal: No bilateral pedal edema noted Skin: Normal Psychiatry: Normal mood  Data Reviewed: CBC: Recent Labs  Lab 04/12/23 1421 04/13/23 0136 04/13/23 0325 04/14/23 0838  WBC 8.6 8.4 11.5* 8.4  NEUTROABS  --   --   --  5.9  HGB 10.1* 10.0* 10.2* 9.0*  HCT 31.7* 31.2* 31.5* 27.8*  MCV 97.5 96.6 95.2 97.2  PLT 451* 440* 452* 387   Basic Metabolic  Panel: Recent Labs  Lab 04/12/23 1421 04/13/23 0136 04/13/23 0325 04/14/23 0838 04/15/23 0542 04/16/23 0349  NA 138  --  137 137 139 138  K 5.6*  --  4.7 4.0 4.5 4.3  CL 108  --  107 107 110 103  CO2 17*  --  21* 22 20* 24  GLUCOSE 98  --  224* 191* 111* 97  BUN 43*  --  36* 19 16 17   CREATININE 3.78* 3.52* 3.40* 2.36* 2.43* 2.37*  CALCIUM 9.2  --  8.9 8.2* 8.7* 9.2   GFR: Estimated Creatinine Clearance: 20.8 mL/min (A) (by C-G formula based on SCr of 2.37 mg/dL (H)). Liver Function Tests: Recent Labs  Lab 04/12/23 1421 04/13/23 0136  AST 17 16  ALT 15 16  ALKPHOS 65 71  BILITOT 0.4 <0.1*  PROT 8.2* 8.1  ALBUMIN 3.2* 3.1*   No results for input(s): "LIPASE", "AMYLASE" in the last 168 hours. No results for input(s): "AMMONIA" in the last 168 hours. Coagulation Profile: No results for input(s): "INR", "PROTIME" in the last 168 hours. Cardiac Enzymes: No results for input(s): "CKTOTAL", "CKMB", "CKMBINDEX", "TROPONINI" in the last 168 hours. BNP (last 3 results) No results for input(s): "PROBNP" in the last 8760 hours. HbA1C: No results for input(s): "HGBA1C" in the last 72 hours. CBG: Recent Labs  Lab 04/15/23 2129 04/16/23 0737  GLUCAP 107* 92   Lipid Profile: No results for input(s): "CHOL", "HDL", "LDLCALC", "TRIG", "CHOLHDL", "LDLDIRECT" in the last 72 hours. Thyroid Function Tests: No results for input(s): "TSH", "T4TOTAL", "FREET4", "T3FREE", "THYROIDAB" in the last 72 hours. Anemia Panel: No results for input(s): "VITAMINB12", "FOLATE", "FERRITIN", "TIBC", "IRON", "RETICCTPCT" in the last 72 hours. Urine analysis:    Component Value Date/Time   COLORURINE AMBER (A) 04/12/2023 1502   APPEARANCEUR TURBID (A) 04/12/2023 1502   LABSPEC 1.010 04/12/2023 1502   PHURINE 5.0 04/12/2023 1502   GLUCOSEU NEGATIVE 04/12/2023 1502   HGBUR SMALL (A) 04/12/2023 1502   BILIRUBINUR NEGATIVE 04/12/2023 1502   KETONESUR NEGATIVE 04/12/2023 1502   PROTEINUR 100 (A)  04/12/2023 1502   UROBILINOGEN 0.2 04/01/2010 1306   NITRITE NEGATIVE 04/12/2023 1502   LEUKOCYTESUR LARGE (A) 04/12/2023 1502   Sepsis Labs: @LABRCNTIP (procalcitonin:4,lacticidven:4)  ) Recent Results (from the past 240 hour(s))  Urine Culture     Status: Abnormal   Collection Time: 04/12/23  4:57 PM   Specimen: Urine, Clean Catch  Result Value Ref Range Status   Specimen Description URINE, CLEAN CATCH  Final   Special Requests NONE  Final   Culture (A)  Final    >=100,000 COLONIES/mL GROUP B STREP(S.AGALACTIAE)ISOLATED TESTING AGAINST S. AGALACTIAE NOT ROUTINELY PERFORMED DUE TO PREDICTABILITY OF AMP/PEN/VAN SUSCEPTIBILITY. Performed at [facility], [address], [place], [place] [address]    Report Status 04/13/2023 FINAL  Final  Culture, blood (routine x 2)     Status: None (Preliminary result)   Collection Time: 04/12/23  5:39 PM   Specimen: BLOOD  Result Value Ref Range Status   Specimen Description BLOOD LEFT ANTECUBITAL  Final   Special Requests   Final    AEROBIC BOTTLE  ONLY Blood Culture results may not be optimal due to an inadequate volume of blood received in culture bottles   Culture   Final    NO GROWTH 4 DAYS Performed at [facility], [address] [place], [place] [address]    Report Status PENDING  Incomplete      Studies: No results found.  Scheduled Meds:  Chlorhexidine Gluconate Cloth  6 each Topical Daily   feeding supplement  237 mL Oral BID BM   finasteride  5 mg Oral Daily   heparin  5,000 Units Subcutaneous Q8H   NIFEdipine  30 mg Oral Daily   tamsulosin  0.4 mg Oral Daily    Continuous Infusions:  cefTRIAXone (ROCEPHIN)  IV 1 g (04/15/23 1510)     LOS: 4 days     Briant Cedar, MD [facility]  If 7PM-7AM, please contact night-coverage [email or web address] 04/16/2023, 2:22 PM

## 2023-04-17 DIAGNOSIS — R339 Retention of urine, unspecified: Secondary | ICD-10-CM | POA: Diagnosis not present

## 2023-04-17 LAB — CBC WITH DIFFERENTIAL/PLATELET
Abs Immature Granulocytes: 0.17 10*3/uL — ABNORMAL HIGH (ref 0.00–0.07)
Basophils Absolute: 0.1 10*3/uL (ref 0.0–0.1)
Basophils Relative: 1 %
Eosinophils Absolute: 0.4 10*3/uL (ref 0.0–0.5)
Eosinophils Relative: 5 %
HCT: 27.6 % — ABNORMAL LOW (ref 39.0–52.0)
Hemoglobin: 8.9 g/dL — ABNORMAL LOW (ref 13.0–17.0)
Immature Granulocytes: 2 %
Lymphocytes Relative: 19 %
Lymphs Abs: 1.6 10*3/uL (ref 0.7–4.0)
MCH: 30.5 pg (ref 26.0–34.0)
MCHC: 32.2 g/dL (ref 30.0–36.0)
MCV: 94.5 fL (ref 80.0–100.0)
Monocytes Absolute: 0.7 10*3/uL (ref 0.1–1.0)
Monocytes Relative: 9 %
Neutro Abs: 5.3 10*3/uL (ref 1.7–7.7)
Neutrophils Relative %: 64 %
Platelets: 399 10*3/uL (ref 150–400)
RBC: 2.92 MIL/uL — ABNORMAL LOW (ref 4.22–5.81)
RDW: 12.8 % (ref 11.5–15.5)
WBC: 8.2 10*3/uL (ref 4.0–10.5)
nRBC: 0 % (ref 0.0–0.2)

## 2023-04-17 LAB — CULTURE, BLOOD (ROUTINE X 2): Culture: NO GROWTH

## 2023-04-17 LAB — BASIC METABOLIC PANEL
Anion gap: 12 (ref 5–15)
BUN: 20 mg/dL (ref 8–23)
CO2: 25 mmol/L (ref 22–32)
Calcium: 9.2 mg/dL (ref 8.9–10.3)
Chloride: 98 mmol/L (ref 98–111)
Creatinine, Ser: 2.36 mg/dL — ABNORMAL HIGH (ref 0.61–1.24)
GFR, Estimated: 29 mL/min — ABNORMAL LOW (ref >60)
Glucose, Bld: 106 mg/dL — ABNORMAL HIGH (ref 70–99)
Potassium: 4.2 mmol/L (ref 3.5–5.1)
Sodium: 135 mmol/L (ref 135–145)

## 2023-04-17 MED ORDER — LACTATED RINGERS IV SOLN: INTRAVENOUS | Status: AC

## 2023-04-17 NOTE — Progress Notes (Signed)
Subjective: First time meeting patient.  He was resting comfortably in bed and confirmed the nurses report that he had been having difficulty with his Foley catheter twisting and pinching off near the urethral meatus.  Objective: Vital signs in last 24 hours: Temp:  [98.2 F (36.8 C)-99 F (37.2 C)] 98.6 F (37 C) (10/16 0850) Pulse Rate:  [60-79] 79 (10/16 0850) Resp:  [18-20] 18 (10/16 0850) BP: (107-129)/(71-77) 107/71 (10/16 0850) SpO2:  [99 %-100 %] 99 % (10/16 0850)  Assessment/Plan: # Urinary retention with bilateral hydronephrosis #AoCKD # Hematuria  Foley catheter placed by Dr. Joyce Gross over wire on 10/11. Some hematuria present prior to catheter placement.  Has resolved on my assessment this morning Contacted by primary team with report that patient's renal function had not improved.  Reviewed repeat CT this playing a distended bladder with Foley catheter in place.  Discussed with nursing who reported that catheter had been spinning and the StatLock causing an obstruction.  Reviewed and demonstrated proper placement of Foley catheter and StatLock and confirmed patency while in the room.  Bladder decompressed per PVR. As long as the catheter is placed correctly in the StatLock going forward patient should hopefully improve.  Difficult to tell what his baseline SCr is. Urology will continue to follow along peripherally.  Please call with questions.  Intake/Output from previous day: 10/15 0701 - 10/16 0700 In: 1060 [P.O.:960; IV Piggyback:100] Out: 1775 [Urine:1775]  Intake/Output this shift: Total I/O In: 220 [P.O.:220] Out: -   Physical Exam:  General: Alert and oriented CV: No cyanosis Lungs: equal chest rise Abdomen: Soft, NTND, no rebound or guarding Gu: Coud catheter in place draining clear yellow urine  Lab Results: Recent Labs    04/17/23 0417  HGB 8.9*  HCT 27.6*   BMET Recent Labs    04/16/23 0349 04/17/23 0417  NA 138 135  K 4.3 4.2  CL 103  98  CO2 24 25  GLUCOSE 97 106*  BUN 17 20  CREATININE 2.37* 2.36*  CALCIUM 9.2 9.2     Studies/Results: CT RENAL STONE STUDY  Result Date: 04/16/2023 CLINICAL DATA:  Bladder neck obstruction.  Bladder spasms and pain. EXAM: CT ABDOMEN AND PELVIS WITHOUT CONTRAST TECHNIQUE: Multidetector CT imaging of the abdomen and pelvis was performed following the standard protocol without IV contrast. RADIATION DOSE REDUCTION: This exam was performed according to the departmental dose-optimization program which includes automated exposure control, adjustment of the mA and/or kV according to patient size and/or use of iterative reconstruction technique. COMPARISON:  04/12/2023 FINDINGS: Lower chest: Lung bases show scarring and emphysema. No change or acute process. Hepatobiliary: Liver parenchyma is normal without contrast. No calcified gallstones. Pancreas: Normal Spleen: Normal Adrenals/Urinary Tract: Adrenal glands are normal. Improved hydronephrosis on the right. Persistent moderate hydronephrosis on the left. No stone seen along the course of either ureter. Foley catheter present within the bladder. Bladder contents are hyperdense Sten normal urine density, the same density as the bladder wall. This could be due to some residual contrast or to hemorrhage. Small dots of air along the left bladder wall, significance doubtful in a patient with an indwelling catheter. Bladder volume is less than was seen 4 days ago. Stomach/Bowel: Stomach and small intestine are normal. No abnormal colon finding. Vascular/Lymphatic: Aortic atherosclerosis. No aneurysm. No sign of adenopathy. Reproductive: Normal Other: No free fluid or air.  No hernia. Musculoskeletal: Chronic lumbar degenerative changes. 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 IMPRESSION: 1. Improved hydronephrosis  on the right. Persistent moderate hydronephrosis on the left. No stone seen along the course of either ureter. 2. Foley catheter  present within the bladder. Bladder contents are hyperdense, the same density as the bladder wall. This could be due to some residual contrast or to hemorrhage. Small dots of air along the left bladder wall, significance doubtful in a patient with an indwelling catheter. Moderate amount of urine within the bladder, but the bladder volume is less than was seen on the study of 04/12/2023. 3. Chronic lumbar degenerative changes. 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 Aortic Atherosclerosis (ICD10-I70.0) and Emphysema (ICD10-J43.9). Electronically Signed   By: Paulina Fusi M.D.   On: 04/16/2023 16:00      LOS: 5 days   Elmon Kirschner, NP [facility] Pager: [phone]  04/17/2023, 11:37 AM

## 2023-04-17 NOTE — Progress Notes (Signed)
PROGRESS NOTE    William Bradshaw  ZDG:[id number] DOB: 12/27/1955 DOA: 04/12/2023 PCP: Ellyn Hack, MD   Brief Narrative: 67 year old with past medical history significant for hypertension presented to the ED 04/12/2023 with chief complaint of difficulty emptying his bladder over the past several months, reporter weak stream, straining to urinate, urgency and frequency as well as abnormal labs from his PCP.  He reports weight loss and poor appetite with weakness.  Evaluation in the ED UA concerning for UTI, urinary retention requiring Foley catheter placed by urology.  Mild hyperkalemia without EKG changes.  AKI, uncertain baseline kidney function with a creatinine 3.7.  CT abdomen and pelvis demonstrated moderate to severe right and left hydronephrosis, hydroureter, concern for bladder outlet obstruction and bladder wall thickening.  Urology was consulted to place Foley catheter.  Patient continued to have pain and bladder spasm.  Repeated CT renal protocol showed distended bladder , Improved hydronephrosis on the right. Persistent moderate hydronephrosis on the left. No stone seen along the course of either ureter.  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 Foley catheter placed by urology.  He will need to be discharged with Foley catheter will need to follow-up with urology as an outpatient Creatinine still 2.3, will resume IV fluids for another 24 hours CT renal protocol with distended bladder, urology to follow-up on patient today.  2-Urinary retention likely secondary to severe bladder outlet obstruction Hematuria Bladder spasm  -CT abdomen and pelvis showed moderate to severe right and moderate left hydronephrosis with dilation of the  ureters to the level of the UVJ.  Urinary bladder is distended up to 12 cm.  Extensive irregular wall thickening throughout the urinary bladder.  Neoplasm cannot be ruled out. Had Foley catheter placed by urology Will need to discharge with Foley catheter He was started on Flomax and finasteride On oxybutynin for bladder spasm Repeated CT renal protocol; showed distended bladder, improved hydronephrosis on the right persistent moderate hydronephrosis on the left Urology will follow-up on patient.  Per nurse patient had had a Foley catheter twisted few times yesterday    Anemia of chronic disease Continue to monitor hemoglobin  GBS UTI Urine culture grows more than 100,000 colonies group B strep collected diet Cultures no growth to date Continue with IV ceftriaxone  Severe malnutrition: The setting of acute illness.  Continue with supplements  Hypertension: Continue with nifedipine  Tobacco and cocaine abuse: Extensive counseling   Nutrition Problem: Severe Malnutrition Etiology: acute illness    Signs/Symptoms: severe fat depletion, severe muscle depletion    Interventions: Ensure Enlive (each supplement provides 350kcal and 20 grams of protein), Snacks  Estimated body mass index is 17.28 kg/m as calculated from the following:   Height as of this encounter: 5\' 7"  (1.702 m).   Weight as of this encounter: 50 kg.   DVT prophylaxis: Heparin  Code Status: Full code Family Communication: Care discussed with patient.  Disposition Plan:  Status is: Inpatient Remains inpatient appropriate because: management of AKI    Consultants:  Urology   Procedures:  Foley catheter placement.   Antimicrobials:    Subjective: He is alert, mild supra pubic pain. Report foley twisted few times yesterday   Objective: Vitals:   04/16/23 5956 04/16/23 1628 04/17/23 3875 04/17/23 6433  BP: 134/82 129/77 111/74 107/71  Pulse: 87 60 76 79  Resp: 18 20 18 18   Temp: 98.5 F (36.9  C) 98.2 F (36.8 C) 99 F (37.2 C) 98.6 F (37 C)  TempSrc: Oral  Oral Oral  SpO2: 100% 100% 100% 99%  Weight:      Height:        Intake/Output Summary (Last 24 hours) at 04/17/2023 1354 Last data filed at 04/17/2023 1300 Gross per 24 hour  Intake 920 ml  Output 2050 ml  Net -1130 ml   Filed Weights   04/12/23 1407 04/13/23 0128  Weight: 53.1 kg 50 kg    Examination:  General exam: Appears calm and comfortable  Respiratory system: Clear to auscultation. Respiratory effort normal. Cardiovascular system: S1 & S2 heard, RRR. No JVD, murmurs, rubs, gallops or clicks. No pedal edema. Gastrointestinal system: Abdomen is nondistended, soft and nontender. No organomegaly or masses felt. Normal bowel sounds heard. Central nervous system: Alert and oriented. No focal neurological deficits. Extremities: Symmetric 5 x 5 power.    Data Reviewed: I have personally reviewed following labs and imaging studies  CBC: Recent Labs  Lab 04/12/23 1421 04/13/23 0136 04/13/23 0325 04/14/23 0838 04/17/23 0417  WBC 8.6 8.4 11.5* 8.4 8.2  NEUTROABS  --   --   --  5.9 5.3  HGB 10.1* 10.0* 10.2* 9.0* 8.9*  HCT 31.7* 31.2* 31.5* 27.8* 27.6*  MCV 97.5 96.6 95.2 97.2 94.5  PLT 451* 440* 452* 387 399   Basic Metabolic Panel: Recent Labs  Lab 04/13/23 0325 04/14/23 0838 04/15/23 0542 04/16/23 0349 04/17/23 0417  NA 137 137 139 138 135  K 4.7 4.0 4.5 4.3 4.2  CL 107 107 110 103 98  CO2 21* 22 20* 24 25  GLUCOSE 224* 191* 111* 97 106*  BUN 36* 19 16 17 20   CREATININE 3.40* 2.36* 2.43* 2.37* 2.36*  CALCIUM 8.9 8.2* 8.7* 9.2 9.2   GFR: Estimated Creatinine Clearance: 20.9 mL/min (A) (by C-G formula based on SCr of 2.36 mg/dL (H)). Liver Function Tests: Recent Labs  Lab 04/12/23 1421 04/13/23 0136  AST 17 16  ALT 15 16  ALKPHOS 65 71  BILITOT 0.4 <0.1*  PROT 8.2* 8.1  ALBUMIN 3.2* 3.1*   No results for input(s): "LIPASE", "AMYLASE" in the last 168 hours. No results for  input(s): "AMMONIA" in the last 168 hours. Coagulation Profile: No results for input(s): "INR", "PROTIME" in the last 168 hours. Cardiac Enzymes: No results for input(s): "CKTOTAL", "CKMB", "CKMBINDEX", "TROPONINI" in the last 168 hours. BNP (last 3 results) No results for input(s): "PROBNP" in the last 8760 hours. HbA1C: No results for input(s): "HGBA1C" in the last 72 hours. CBG: Recent Labs  Lab 04/15/23 2129 04/16/23 0737  GLUCAP 107* 92   Lipid Profile: No results for input(s): "CHOL", "HDL", "LDLCALC", "TRIG", "CHOLHDL", "LDLDIRECT" in the last 72 hours. Thyroid Function Tests: No results for input(s): "TSH", "T4TOTAL", "FREET4", "T3FREE", "THYROIDAB" in the last 72 hours. Anemia Panel: No results for input(s): "VITAMINB12", "FOLATE", "FERRITIN", "TIBC", "IRON", "RETICCTPCT" in the last 72 hours. Sepsis Labs: Recent Labs  Lab 04/12/23 1746 04/12/23 2059  LATICACIDVEN 1.3 1.2    Recent Results (from the past 240 hour(s))  Urine Culture     Status: Abnormal   Collection Time: 04/12/23  4:57 PM   Specimen: Urine, Clean Catch  Result Value Ref Range Status   Specimen Description URINE, CLEAN CATCH  Final   Special Requests NONE  Final  Culture (A)  Final    >=100,000 COLONIES/mL GROUP B STREP(S.AGALACTIAE)ISOLATED TESTING AGAINST S. AGALACTIAE NOT ROUTINELY PERFORMED DUE TO PREDICTABILITY OF AMP/PEN/VAN SUSCEPTIBILITY. Performed at [facility], [address], [place], [place] [address]    Report Status 04/13/2023 FINAL  Final  Culture, blood (routine x 2)     Status: None   Collection Time: 04/12/23  5:39 PM   Specimen: BLOOD  Result Value Ref Range Status   Specimen Description BLOOD LEFT ANTECUBITAL  Final   Special Requests   Final    AEROBIC BOTTLE ONLY Blood Culture results may not be optimal due to an inadequate volume of blood received in culture bottles   Culture   Final    NO GROWTH 5 DAYS Performed at [facility], [address],  [place], [place] [address]    Report Status 04/17/2023 FINAL  Final         Radiology Studies: CT RENAL STONE STUDY  Result Date: 04/16/2023 CLINICAL DATA:  Bladder neck obstruction.  Bladder spasms and pain. EXAM: CT ABDOMEN AND PELVIS WITHOUT CONTRAST TECHNIQUE: Multidetector CT imaging of the abdomen and pelvis was performed following the standard protocol without IV contrast. RADIATION DOSE REDUCTION: This exam was performed according to the departmental dose-optimization program which includes automated exposure control, adjustment of the mA and/or kV according to patient size and/or use of iterative reconstruction technique. COMPARISON:  04/12/2023 FINDINGS: Lower chest: Lung bases show scarring and emphysema. No change or acute process. Hepatobiliary: Liver parenchyma is normal without contrast. No calcified gallstones. Pancreas: Normal Spleen: Normal Adrenals/Urinary Tract: Adrenal glands are normal. Improved hydronephrosis on the right. Persistent moderate hydronephrosis on the left. No stone seen along the course of either ureter. Foley catheter present within the bladder. Bladder contents are hyperdense Sten normal urine density, the same density as the bladder wall. This could be due to some residual contrast or to hemorrhage. Small dots of air along the left bladder wall, significance doubtful in a patient with an indwelling catheter. Bladder volume is less than was seen 4 days ago. Stomach/Bowel: Stomach and small intestine are normal. No abnormal colon finding. Vascular/Lymphatic: Aortic atherosclerosis. No aneurysm. No sign of adenopathy. Reproductive: Normal Other: No free fluid or air.  No hernia. Musculoskeletal: Chronic lumbar degenerative changes. 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 IMPRESSION: 1. Improved hydronephrosis on the right. Persistent moderate hydronephrosis on the left. No stone seen along the course of either ureter. 2. Foley catheter present within the  bladder. Bladder contents are hyperdense, the same density as the bladder wall. This could be due to some residual contrast or to hemorrhage. Small dots of air along the left bladder wall, significance doubtful in a patient with an indwelling catheter. Moderate amount of urine within the bladder, but the bladder volume is less than was seen on the study of 04/12/2023. 3. Chronic lumbar degenerative changes. 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 Aortic Atherosclerosis (ICD10-I70.0) and Emphysema (ICD10-J43.9). Electronically Signed   By: Paulina Fusi M.D.   On: 04/16/2023 16:00        Scheduled Meds:  Chlorhexidine Gluconate Cloth  6 each Topical Daily   feeding supplement  237 mL Oral BID BM   finasteride  5 mg Oral Daily   heparin  5,000 Units Subcutaneous Q8H   NIFEdipine  30 mg Oral Daily   tamsulosin  0.4 mg Oral Daily   Continuous Infusions:  cefTRIAXone (ROCEPHIN)  IV 1 g (04/16/23 1616)  lactated ringers 100 mL/hr at 04/17/23 0824     LOS: 5 days    Time spent: 35 minutes.     Alba Cory, MD [facility]   If 7PM-7AM, please contact night-coverage [email or web address]  04/17/2023, 1:54 PM

## 2023-04-17 NOTE — Plan of Care (Signed)

## 2023-04-17 NOTE — Plan of Care (Signed)
  Problem: Education: Goal: Knowledge of General Education information will improve Description: Including pain rating scale, medication(s)/side effects and non-pharmacologic comfort measures 04/17/2023 2021 by Irwin Brakeman, RN Outcome: Progressing 04/17/2023 1443 by Irwin Brakeman, RN Outcome: Progressing   Problem: Health Behavior/Discharge Planning: Goal: Ability to manage health-related needs will improve 04/17/2023 2021 by Irwin Brakeman, RN Outcome: Progressing 04/17/2023 1443 by Irwin Brakeman, RN Outcome: Progressing   Problem: Clinical Measurements: Goal: Ability to maintain clinical measurements within normal limits will improve 04/17/2023 2021 by Irwin Brakeman, RN Outcome: Progressing 04/17/2023 1443 by Irwin Brakeman, RN Outcome: Progressing Goal: Will remain free from infection 04/17/2023 2021 by Irwin Brakeman, RN Outcome: Progressing 04/17/2023 1443 by Irwin Brakeman, RN Outcome: Progressing Goal: Diagnostic test results will improve 04/17/2023 2021 by Irwin Brakeman, RN Outcome: Progressing 04/17/2023 1443 by Irwin Brakeman, RN Outcome: Progressing Goal: Respiratory complications will improve 04/17/2023 2021 by Irwin Brakeman, RN Outcome: Progressing 04/17/2023 1443 by Irwin Brakeman, RN Outcome: Progressing Goal: Cardiovascular complication will be avoided 04/17/2023 2021 by Irwin Brakeman, RN Outcome: Progressing 04/17/2023 1443 by Irwin Brakeman, RN Outcome: Progressing   Problem: Activity: Goal: Risk for activity intolerance will decrease 04/17/2023 2021 by Irwin Brakeman, RN Outcome: Progressing 04/17/2023 1443 by Irwin Brakeman, RN Outcome: Progressing   Problem: Nutrition: Goal: Adequate nutrition will be maintained 04/17/2023 2021 by Irwin Brakeman, RN Outcome: Progressing 04/17/2023 1443 by  Irwin Brakeman, RN Outcome: Progressing   Problem: Coping: Goal: Level of anxiety will decrease 04/17/2023 2021 by Irwin Brakeman, RN Outcome: Progressing 04/17/2023 1443 by Irwin Brakeman, RN Outcome: Progressing   Problem: Elimination: Goal: Will not experience complications related to bowel motility 04/17/2023 2021 by Irwin Brakeman, RN Outcome: Progressing 04/17/2023 1443 by Irwin Brakeman, RN Outcome: Progressing Goal: Will not experience complications related to urinary retention 04/17/2023 2021 by Irwin Brakeman, RN Outcome: Progressing 04/17/2023 1443 by Irwin Brakeman, RN Outcome: Progressing   Problem: Pain Managment: Goal: General experience of comfort will improve 04/17/2023 2021 by Irwin Brakeman, RN Outcome: Progressing 04/17/2023 1443 by Irwin Brakeman, RN Outcome: Progressing   Problem: Safety: Goal: Ability to remain free from injury will improve 04/17/2023 2021 by Irwin Brakeman, RN Outcome: Progressing 04/17/2023 1443 by Irwin Brakeman, RN Outcome: Progressing

## 2023-04-18 DIAGNOSIS — R339 Retention of urine, unspecified: Secondary | ICD-10-CM | POA: Diagnosis not present

## 2023-04-18 LAB — BASIC METABOLIC PANEL
Anion gap: 11 (ref 5–15)
BUN: 20 mg/dL (ref 8–23)
CO2: 24 mmol/L (ref 22–32)
Calcium: 8.5 mg/dL — ABNORMAL LOW (ref 8.9–10.3)
Chloride: 98 mmol/L (ref 98–111)
Creatinine, Ser: 2.25 mg/dL — ABNORMAL HIGH (ref 0.61–1.24)
GFR, Estimated: 31 mL/min — ABNORMAL LOW (ref >60)
Glucose, Bld: 142 mg/dL — ABNORMAL HIGH (ref 70–99)
Potassium: 4.3 mmol/L (ref 3.5–5.1)
Sodium: 133 mmol/L — ABNORMAL LOW (ref 135–145)

## 2023-04-18 MED ORDER — AMOXICILLIN 500 MG PO CAPS: 500.0000 mg | ORAL_CAPSULE | Freq: Two times a day (BID) | ORAL | 0 refills | Status: AC

## 2023-04-18 MED ORDER — TAMSULOSIN HCL 0.4 MG PO CAPS: 0.4000 mg | ORAL_CAPSULE | Freq: Every day | ORAL | 3 refills | Status: AC

## 2023-04-18 MED ORDER — POLYETHYLENE GLYCOL 3350 17 G PO PACK: 17.0000 g | PACK | Freq: Every day | ORAL | 0 refills | Status: AC | PRN

## 2023-04-18 MED ORDER — FINASTERIDE 5 MG PO TABS: 5.0000 mg | ORAL_TABLET | Freq: Every day | ORAL | 3 refills | Status: AC

## 2023-04-18 MED ORDER — OXYBUTYNIN CHLORIDE 5 MG PO TABS: 5.0000 mg | ORAL_TABLET | Freq: Three times a day (TID) | ORAL | 0 refills | Status: DC | PRN

## 2023-04-18 NOTE — Discharge Summary (Signed)
Physician Discharge Summary   Patient: William Bradshaw MRN: [id number] DOB: 1955/10/25  Admit date:     04/12/2023  Discharge date: 04/18/23  Discharge Physician: Alba Cory   PCP: Ellyn Hack, MD   Recommendations at discharge:    Needs Bmet to follow renal function.  Needs follow up with Urology Dr Berneice Heinrich    Discharge Diagnoses: Principal Problem:   Urinary retention Active Problems:   Protein-calorie malnutrition, severe  Resolved Problems:   * No resolved hospital problems. [facility] Course: 67 year old with past medical history significant for hypertension presented to the ED 04/12/2023 with chief complaint of difficulty emptying his bladder over the past several months, reporter weak stream, straining to urinate, urgency and frequency as well as abnormal labs from his PCP.  He reports weight loss and poor appetite with weakness.  Evaluation in the ED UA concerning for UTI, urinary retention requiring Foley catheter placed by urology.  Mild hyperkalemia without EKG changes.  AKI, uncertain baseline kidney function with a creatinine 3.7.  CT abdomen and pelvis demonstrated moderate to severe right and left hydronephrosis, hydroureter, concern for bladder outlet obstruction and bladder wall thickening.  Urology was consulted to place Foley catheter.   Patient continued to have pain and bladder spasm.  Repeated CT renal protocol showed distended bladder , Improved hydronephrosis on the right. Persistent moderate hydronephrosis on the left. No stone seen along the course of either ureter.  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 Foley catheter placed by urology.  He will need to be discharged with Foley catheter will need to follow-up with urology as an outpatient CT renal protocol with distended bladder, urology to  follow-up on patient today. Cr stable at 2.2 . Previous baseline 1.9 Plan to discharge home. Close follow up with PCP. Encourage oral intake.   2-Urinary retention likely secondary to severe bladder outlet obstruction Hematuria Bladder spasm  -CT abdomen and pelvis showed moderate to severe right and moderate left hydronephrosis with dilation of the ureters to the level of the UVJ.  Urinary bladder is distended up to 12 cm.  Extensive irregular wall thickening throughout the urinary bladder.  Neoplasm cannot be ruled out. Had Foley catheter placed by urology Will need to discharge with Foley catheter He was started on Flomax and finasteride On oxybutynin for bladder spasm Repeated CT renal protocol; showed distended bladder, improved hydronephrosis on the right persistent moderate hydronephrosis on the left Urology will follow-up on patient.  Per nurse patient had had a Foley catheter twisted few times yesterday  Renal function stable. Needs to follow up with Urology out patient.    Anemia of chronic disease Continue to monitor hemoglobin   GBS UTI Urine culture grows more than 100,000 colonies group B strep collected diet Cultures no growth to date Treated with IV ceftriaxone for 5 days. Discharge on Ampicillin for 5 days.    Severe malnutrition: The setting of acute illness.  Continue with supplements   Hypertension: Continue with nifedipine   Tobacco and cocaine abuse: Extensive counseling     Nutrition Problem: Severe Malnutrition Etiology: acute illness       Signs/Symptoms: severe fat depletion, severe muscle depletion         Consultants: Urology  Procedures performed: none Disposition: Home Diet recommendation:  Discharge Diet Orders (From admission, onward)     Start  Ordered   04/18/23 0000  Diet general        04/18/23 1058           Regular diet DISCHARGE MEDICATION: Allergies as of 04/18/2023   No Known Allergies      Medication List      STOP taking these medications    amLODipine 2.5 MG tablet Commonly known as: NORVASC       TAKE these medications    amoxicillin 500 MG capsule Commonly known as: AMOXIL Take 1 capsule (500 mg total) by mouth 2 (two) times daily for 5 days.   finasteride 5 MG tablet Commonly known as: PROSCAR Take 1 tablet (5 mg total) by mouth daily. Start taking on: April 19, 2023   NIFEdipine 30 MG 24 hr tablet Commonly known as: PROCARDIA-XL/NIFEDICAL-XL Take 30 mg by mouth daily.   oxybutynin 5 MG tablet Commonly known as: DITROPAN Take 1 tablet (5 mg total) by mouth 3 (three) times daily as needed for bladder spasms.   polyethylene glycol 17 g packet Commonly known as: MIRALAX / GLYCOLAX Take 17 g by mouth daily as needed for mild constipation.   tamsulosin 0.4 MG Caps capsule Commonly known as: FLOMAX Take 1 capsule (0.4 mg total) by mouth daily.        Follow-up Information     Ellyn Hack, MD Follow up in 1 week(s).   Specialty: Family Medicine Why: you need lab work on Monday Contact information: [address] [place] [address] [phone]         Loletta Parish., MD Follow up in 2 week(s).   Specialty: Urology Contact information: [address] [place] [address] [phone]                Discharge Exam: Ceasar Mons Weights   04/12/23 1407 04/13/23 0128  Weight: 53.1 kg 50 kg   General; NAD  Condition at discharge: stable  The results of significant diagnostics from this hospitalization (including imaging, microbiology, ancillary and laboratory) are listed below for reference.   Imaging Studies: CT RENAL STONE STUDY  Result Date: 04/16/2023 CLINICAL DATA:  Bladder neck obstruction.  Bladder spasms and pain. EXAM: CT ABDOMEN AND PELVIS WITHOUT CONTRAST TECHNIQUE: Multidetector CT imaging of the abdomen and pelvis was performed following the standard protocol without IV contrast. RADIATION DOSE REDUCTION: This exam was  performed according to the departmental dose-optimization program which includes automated exposure control, adjustment of the mA and/or kV according to patient size and/or use of iterative reconstruction technique. COMPARISON:  04/12/2023 FINDINGS: Lower chest: Lung bases show scarring and emphysema. No change or acute process. Hepatobiliary: Liver parenchyma is normal without contrast. No calcified gallstones. Pancreas: Normal Spleen: Normal Adrenals/Urinary Tract: Adrenal glands are normal. Improved hydronephrosis on the right. Persistent moderate hydronephrosis on the left. No stone seen along the course of either ureter. Foley catheter present within the bladder. Bladder contents are hyperdense Sten normal urine density, the same density as the bladder wall. This could be due to some residual contrast or to hemorrhage. Small dots of air along the left bladder wall, significance doubtful in a patient with an indwelling catheter. Bladder volume is less than was seen 4 days ago. Stomach/Bowel: Stomach and small intestine are normal. No abnormal colon finding. Vascular/Lymphatic: Aortic atherosclerosis. No aneurysm. No sign of adenopathy. Reproductive: Normal Other: No free fluid or air.  No hernia. Musculoskeletal: Chronic lumbar degenerative changes. 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 IMPRESSION: 1. Improved hydronephrosis  on the right. Persistent moderate hydronephrosis on the left. No stone seen along the course of either ureter. 2. Foley catheter present within the bladder. Bladder contents are hyperdense, the same density as the bladder wall. This could be due to some residual contrast or to hemorrhage. Small dots of air along the left bladder wall, significance doubtful in a patient with an indwelling catheter. Moderate amount of urine within the bladder, but the bladder volume is less than was seen on the study of 04/12/2023. 3. Chronic lumbar degenerative changes. Chronic pars defects  at L3 with chronic anterolisthesis. Aortic Atherosclerosis (ICD10-I70.0) and Emphysema (ICD10-J43.9). Electronically Signed   By: Paulina Fusi M.D.   On: 04/16/2023 16:00   CT ABDOMEN PELVIS WO CONTRAST  Result Date: 04/12/2023 CLINICAL DATA:  Abdominal pain, acute, nonlocalized. EXAM: CT ABDOMEN AND PELVIS WITHOUT CONTRAST TECHNIQUE: Multidetector CT imaging of the abdomen and pelvis was performed following the standard protocol without IV contrast. RADIATION DOSE REDUCTION: This exam was performed according to the departmental dose-optimization program which includes automated exposure control, adjustment of the mA and/or kV according to patient size and/or use of iterative reconstruction technique. COMPARISON:  Ultrasound of the abdominal aorta 04/11/2023. FINDINGS: Lower chest: The lung bases are clear without focal nodule, mass, or airspace disease. Heart size is normal. Hepatobiliary: No focal liver abnormality is seen. No gallstones, gallbladder wall thickening, or biliary dilatation. Pancreas: Unremarkable. No pancreatic ductal dilatation or surrounding inflammatory changes. Spleen: Normal in size without focal abnormality. Adrenals/Urinary Tract: Adrenal glands are normal bilaterally. Moderate to severe right and moderate left hydronephrosis is present. The ureters are dilated to the level of the UVJ. No stone or mass lesion is present in either kidney. Parenchymal density is more hypodense on the right. The urinary bladder is distended to the scratched at the urinary bladder is distended up to 12.7 cm in cephalo caudad dimension. Extensive irregular wall thickening is present throughout the urinary bladder. Stomach/Bowel: Stomach and duodenum are within normal limits. Small bowel is unremarkable. Terminal ileum is within normal limits. The ascending and transverse colon are within normal limits. The descending and sigmoid colon are normal. Vascular/Lymphatic: Atherosclerotic calcifications are present  in the aorta and branch vessels. No aneurysm is present. No significant adenopathy is present. Reproductive: The prostate gland is mildly enlarged, measuring up to 3.5 cm. Other: No abdominal wall hernia or abnormality. No abdominopelvic ascites. Musculoskeletal: Grade 1 degenerative anterolisthesis at L3-4 measures 7 mm secondary to bilateral L3 pars defects. Chronic endplate sclerotic changes are present at L3-4. Focal osseous lesions are present. Bony pelvis is within normal limits. The hips are located and normal bilaterally. IMPRESSION: 1. Moderate to severe right and moderate left hydronephrosis with dilation of the ureters to the level of the UVJ. 2. The urinary bladder is distended up to 12.7 cm in cephalo caudad dimension. Extensive irregular wall thickening is present throughout the urinary bladder. This is consistent with severe bladder outlet obstruction. Neoplasm is not excluded. Recommend Urology consultation. 3. Mildly enlarged prostate gland. 4. Grade 1 degenerative anterolisthesis at L3-4 secondary to bilateral L3 pars defects. 5.  Aortic Atherosclerosis (ICD10-I70.0). These results were called by telephone at the time of interpretation on 04/12/2023 at 7:31 pm to provider [facility] , who verbally acknowledged these results. Electronically Signed   By: Marin Roberts M.D.   On: 04/12/2023 19:37   DG Chest 2 View  Result Date: 04/12/2023 CLINICAL DATA:  Cough.  Patient's lightheaded and feeling weak. EXAM: CHEST - 2 VIEW  COMPARISON:  Two-view chest x-ray 12/29/2021 FINDINGS: The heart size and mediastinal contours are within normal limits. Both lungs are clear. The visualized skeletal structures are unremarkable. IMPRESSION: Negative two view chest x-ray Electronically Signed   By: Marin Roberts M.D.   On: 04/12/2023 19:18   US AORTA MEDICARE SCREENING  Result Date: 04/11/2023 CLINICAL DATA:  Male between 31-61 years of age with a smoking history. EXAM: US ABDOMINAL AORTA  MEDICARE SCREENING TECHNIQUE: Ultrasound examination of the abdominal aorta was performed as a screening evaluation for abdominal aortic aneurysm. COMPARISON:  None Available. FINDINGS: Abdominal aortic measurements as follows: Proximal:  2.4 x 2.0 cm Mid:  1.8 x 1.9 cm Distal:  1.6 x 1.5 cm IMPRESSION: No evidence of abdominal aortic aneurysm. Electronically Signed   By: Harmon Pier M.D.   On: 04/11/2023 16:37    Microbiology: Results for orders placed or performed during the hospital encounter of 04/12/23  Urine Culture     Status: Abnormal   Collection Time: 04/12/23  4:57 PM   Specimen: Urine, Clean Catch  Result Value Ref Range Status   Specimen Description URINE, CLEAN CATCH  Final   Special Requests NONE  Final   Culture (A)  Final    >=100,000 COLONIES/mL GROUP B STREP(S.AGALACTIAE)ISOLATED TESTING AGAINST S. AGALACTIAE NOT ROUTINELY PERFORMED DUE TO PREDICTABILITY OF AMP/PEN/VAN SUSCEPTIBILITY. Performed at [facility], [address] [place], [place] [address]    Report Status 04/13/2023 FINAL  Final  Culture, blood (routine x 2)     Status: None   Collection Time: 04/12/23  5:39 PM   Specimen: BLOOD  Result Value Ref Range Status   Specimen Description BLOOD LEFT ANTECUBITAL  Final   Special Requests   Final    AEROBIC BOTTLE ONLY Blood Culture results may not be optimal due to an inadequate volume of blood received in culture bottles   Culture   Final    NO GROWTH 5 DAYS Performed at [facility], [address] [place], [place] [address]    Report Status 04/17/2023 FINAL  Final    Labs: CBC: Recent Labs  Lab 04/12/23 1421 04/13/23 0136 04/13/23 0325 04/14/23 0838 04/17/23 0417  WBC 8.6 8.4 11.5* 8.4 8.2  NEUTROABS  --   --   --  5.9 5.3  HGB 10.1* 10.0* 10.2* 9.0* 8.9*  HCT 31.7* 31.2* 31.5* 27.8* 27.6*  MCV 97.5 96.6 95.2 97.2 94.5  PLT 451* 440* 452* 387 399   Basic Metabolic Panel: Recent Labs  Lab 04/14/23 0838 04/15/23 0542  04/16/23 0349 04/17/23 0417 04/18/23 0822  NA 137 139 138 135 133*  K 4.0 4.5 4.3 4.2 4.3  CL 107 110 103 98 98  CO2 22 20* 24 25 24   GLUCOSE 191* 111* 97 106* 142*  BUN 19 16 17 20 20   CREATININE 2.36* 2.43* 2.37* 2.36* 2.25*  CALCIUM 8.2* 8.7* 9.2 9.2 8.5*   Liver Function Tests: Recent Labs  Lab 04/12/23 1421 04/13/23 0136  AST 17 16  ALT 15 16  ALKPHOS 65 71  BILITOT 0.4 <0.1*  PROT 8.2* 8.1  ALBUMIN 3.2* 3.1*   CBG: Recent Labs  Lab 04/15/23 2129 04/16/23 0737  GLUCAP 107* 92    Discharge time spent: greater than 30 minutes.  Signed: Alba Cory, MD [facility] 04/18/2023

## 2023-04-18 NOTE — TOC Transition Note (Signed)
Transition of Care [facility]) - CM/SW Discharge Note   Patient Details  Name: William Bradshaw MRN: [id number] Date of Birth: 06/13/1956  [facility] of [facility] CM/SW Contact:  Tom-Johnson, Hershal Coria, RN Phone Number: 04/18/2023, 12:26 PM   Clinical Narrative:     Patient is scheduled for discharge today.  Readmission Risk Assessment done. Hospital f/u and discharge instructions on AVS. No TOC needs or recommendations noted. Despina Hick to transport at discharge.  No further TOC needs noted.        Final next level of care: Home/Self Care Barriers to Discharge: Barriers Resolved   Patient Goals and CMS Choice CMS Medicare.gov Compare Post Acute Care list provided to:: Patient Choice offered to / list presented to : NA  Discharge Placement                  Patient to be transferred to facility by: Cousin Name of family member notified: [facility] and Services Additional resources added to the After Visit Summary for                  DME Arranged: N/A DME Agency: NA       HH Arranged: NA HH Agency: NA        Social Determinants of Health (SDOH) Interventions SDOH Screenings   Food Insecurity: No Food Insecurity (04/13/2023)  Housing: Low Risk  (04/13/2023)  Transportation Needs: No Transportation Needs (04/13/2023)  Utilities: Not At Risk (04/13/2023)  Tobacco Use: High Risk (04/13/2023)     Readmission Risk Interventions    04/18/2023   12:25 PM  Readmission Risk Prevention Plan  Post Dischage Appt Complete  Medication Screening Complete  Transportation Screening Complete

## 2023-05-02 ENCOUNTER — Encounter: Payer: Self-pay | Admitting: Family Medicine

## 2023-05-02 ENCOUNTER — Other Ambulatory Visit: Payer: Self-pay | Admitting: Family Medicine

## 2023-05-02 DIAGNOSIS — N400 Enlarged prostate without lower urinary tract symptoms: Secondary | ICD-10-CM

## 2023-09-02 ENCOUNTER — Encounter (HOSPITAL_COMMUNITY): Payer: Self-pay

## 2023-09-02 ENCOUNTER — Emergency Department (HOSPITAL_COMMUNITY)
Admission: EM | Admit: 2023-09-02 | Discharge: 2023-09-02 | Discharge status: Against Medical Advice | Attending: Emergency Medicine | Admitting: Emergency Medicine

## 2023-09-02 ENCOUNTER — Other Ambulatory Visit: Payer: Self-pay

## 2023-09-02 DIAGNOSIS — T83098A Other mechanical complication of other indwelling urethral catheter, initial encounter: Secondary | ICD-10-CM | POA: Insufficient documentation

## 2023-09-02 DIAGNOSIS — Y732 Prosthetic and other implants, materials and accessory gastroenterology and urology devices associated with adverse incidents: Secondary | ICD-10-CM | POA: Insufficient documentation

## 2023-09-02 DIAGNOSIS — Z5321 Procedure and treatment not carried out due to patient leaving prior to being seen by health care provider: Secondary | ICD-10-CM | POA: Diagnosis not present

## 2023-09-02 NOTE — ED Triage Notes (Signed)
 Pt states his foley needs to be changed. Pt says the urine isn't flowing out like it should and he starts having spasms in his bladder. Pt adjusted bag and stated the blockage isn't there any more and the bag needs to be changed. RN offered pt a urinal to empty bag.

## 2023-09-02 NOTE — ED Notes (Signed)
 Pt drained 525 mL of urine from foley bag.

## 2023-09-05 ENCOUNTER — Emergency Department (HOSPITAL_COMMUNITY)
Admission: EM | Admit: 2023-09-05 | Discharge: 2023-09-06 | Disposition: A | Discharge status: Home / Self Care | Attending: Emergency Medicine | Admitting: Emergency Medicine

## 2023-09-05 DIAGNOSIS — Y732 Prosthetic and other implants, materials and accessory gastroenterology and urology devices associated with adverse incidents: Secondary | ICD-10-CM | POA: Diagnosis not present

## 2023-09-05 DIAGNOSIS — T83511A Infection and inflammatory reaction due to indwelling urethral catheter, initial encounter: Secondary | ICD-10-CM | POA: Diagnosis not present

## 2023-09-05 DIAGNOSIS — R339 Retention of urine, unspecified: Secondary | ICD-10-CM

## 2023-09-05 DIAGNOSIS — N390 Urinary tract infection, site not specified: Secondary | ICD-10-CM | POA: Diagnosis not present

## 2023-09-06 ENCOUNTER — Encounter (HOSPITAL_COMMUNITY): Payer: Self-pay | Admitting: Emergency Medicine

## 2023-09-06 ENCOUNTER — Other Ambulatory Visit: Payer: Self-pay

## 2023-09-06 DIAGNOSIS — T83511A Infection and inflammatory reaction due to indwelling urethral catheter, initial encounter: Secondary | ICD-10-CM | POA: Diagnosis not present

## 2023-09-06 LAB — CBC WITH DIFFERENTIAL/PLATELET
Abs Immature Granulocytes: 0.03 10*3/uL (ref 0.00–0.07)
Basophils Absolute: 0 10*3/uL (ref 0.0–0.1)
Basophils Relative: 0 %
Eosinophils Absolute: 0 10*3/uL (ref 0.0–0.5)
Eosinophils Relative: 0 %
HCT: 35.3 % — ABNORMAL LOW (ref 39.0–52.0)
Hemoglobin: 12 g/dL — ABNORMAL LOW (ref 13.0–17.0)
Immature Granulocytes: 1 %
Lymphocytes Relative: 9 %
Lymphs Abs: 0.6 10*3/uL — ABNORMAL LOW (ref 0.7–4.0)
MCH: 31.4 pg (ref 26.0–34.0)
MCHC: 34 g/dL (ref 30.0–36.0)
MCV: 92.4 fL (ref 80.0–100.0)
Monocytes Absolute: 0.3 10*3/uL (ref 0.1–1.0)
Monocytes Relative: 5 %
Neutro Abs: 5.3 10*3/uL (ref 1.7–7.7)
Neutrophils Relative %: 85 %
Platelets: 299 10*3/uL (ref 150–400)
RBC: 3.82 MIL/uL — ABNORMAL LOW (ref 4.22–5.81)
RDW: 13 % (ref 11.5–15.5)
WBC: 6.2 10*3/uL (ref 4.0–10.5)
nRBC: 0 % (ref 0.0–0.2)

## 2023-09-06 LAB — BASIC METABOLIC PANEL
Anion gap: 12 (ref 5–15)
BUN: 30 mg/dL — ABNORMAL HIGH (ref 8–23)
CO2: 23 mmol/L (ref 22–32)
Calcium: 9.3 mg/dL (ref 8.9–10.3)
Chloride: 100 mmol/L (ref 98–111)
Creatinine, Ser: 2.35 mg/dL — ABNORMAL HIGH (ref 0.61–1.24)
GFR, Estimated: 29 mL/min — ABNORMAL LOW (ref >60)
Glucose, Bld: 132 mg/dL — ABNORMAL HIGH (ref 70–99)
Potassium: 4.9 mmol/L (ref 3.5–5.1)
Sodium: 135 mmol/L (ref 135–145)

## 2023-09-06 LAB — URINALYSIS, ROUTINE W REFLEX MICROSCOPIC
Bilirubin Urine: NEGATIVE
Glucose, UA: 50 mg/dL — AB
Ketones, ur: NEGATIVE mg/dL
Nitrite: POSITIVE — AB
Protein, ur: 30 mg/dL — AB
RBC / HPF: >50 RBC/hpf (ref 0–5)
Specific Gravity, Urine: 1.013 (ref 1.005–1.030)
WBC, UA: >50 WBC/hpf (ref 0–5)
pH: 7 (ref 5.0–8.0)

## 2023-09-06 MED ORDER — CEPHALEXIN 500 MG PO CAPS: 500.0000 mg | ORAL_CAPSULE | Freq: Two times a day (BID) | ORAL | 0 refills | Status: DC

## 2023-09-06 MED ORDER — SODIUM CHLORIDE 0.9 % IV SOLN
1.0000 g | Freq: Once | INTRAVENOUS | Status: AC
  Administered 2023-09-06: 1 g via INTRAVENOUS
  Filled 2023-09-06: qty 10

## 2023-09-06 MED ORDER — CEPHALEXIN 500 MG PO CAPS: 500.0000 mg | ORAL_CAPSULE | Freq: Two times a day (BID) | ORAL | 0 refills | Status: AC

## 2023-09-06 MED ORDER — OXYBUTYNIN CHLORIDE 5 MG PO TABS: 5.0000 mg | ORAL_TABLET | Freq: Three times a day (TID) | ORAL | 0 refills | Status: DC | PRN

## 2023-09-06 MED ORDER — OXYBUTYNIN CHLORIDE 5 MG PO TABS: 5.0000 mg | ORAL_TABLET | Freq: Three times a day (TID) | ORAL | 0 refills | Status: AC | PRN

## 2023-09-06 NOTE — ED Notes (Signed)
 Drainage bag changed and no urine output. Spoke with PA about exchanging catheter and opted to wait until patient was seen in room by provider.

## 2023-09-06 NOTE — ED Provider Notes (Signed)
 [facility] Emergency Department Provider Note MRN:  [id number]  Arrival date & time: 09/06/23     Chief Complaint   Abdominal Pain   History of Present Illness   William Bradshaw is a 68 y.o. year-old male presents to the ED with chief complaint of dysfunctional Foley catheter.  Patient has chronic indwelling catheter secondary to urinary retention.  He states that his catheter was last exchanged about 1 month ago.  He states that this afternoon it quit draining.  He reports significant bladder spasms and suprapubic abdominal pain.  He denies fevers or chills.  Denies any other new or associated symptoms.  History provided by patient.   Review of Systems  Pertinent positive and negative review of systems noted in HPI.    Physical Exam   Vitals:   09/06/23 0345 09/06/23 0400  BP:  (!) 143/80  Pulse:  66  Resp:  15  Temp: 98.6 F (37 C)   SpO2:  100%    CONSTITUTIONAL:  uncomfortable-appearing, NAD NEURO:  Alert and oriented x 3, CN 3-12 grossly intact EYES:  eyes equal and reactive ENT/NECK:  Supple, no stridor  CARDIO:  tachycardic, regular rhythm, appears well-perfused  PULM:  No respiratory distress, CTAB GI/GU:  non-distended, suprapubic pressure MSK/SPINE:  No gross deformities, no edema, moves all extremities  SKIN:  no rash, atraumatic   *Additional and/or pertinent findings included in MDM below  Diagnostic and Interventional Summary    EKG Interpretation Date/Time:    Ventricular Rate:    PR Interval:    QRS Duration:    QT Interval:    QTC Calculation:   R Axis:      Text Interpretation:         Labs Reviewed  URINALYSIS, ROUTINE W REFLEX MICROSCOPIC - Abnormal; Notable for the following components:      Result Value   APPearance HAZY (*)    Glucose, UA 50 (*)    Hgb urine dipstick MODERATE (*)    Protein, ur 30 (*)    Nitrite POSITIVE (*)    Leukocytes,Ua LARGE (*)    Bacteria, UA FEW (*)    All other components  within normal limits  CBC WITH DIFFERENTIAL/PLATELET - Abnormal; Notable for the following components:   RBC 3.82 (*)    Hemoglobin 12.0 (*)    HCT 35.3 (*)    Lymphs Abs 0.6 (*)    All other components within normal limits  BASIC METABOLIC PANEL - Abnormal; Notable for the following components:   Glucose, Bld 132 (*)    BUN 30 (*)    Creatinine, Ser 2.35 (*)    GFR, Estimated 29 (*)    All other components within normal limits    No orders to display    Medications  cefTRIAXone (ROCEPHIN) 1 g in sodium chloride 0.9 % 100 mL IVPB (1 g Intravenous New Bag/Given 09/06/23 0349)     Procedures  /  Critical Care Procedures  ED Course and Medical Decision Making  I have reviewed the triage vital signs, the nursing notes, and pertinent available records from the EMR.  Social Determinants Affecting Complexity of Care: Patient has no clinically significant social determinants affecting this chief complaint..   ED Course: Clinical Course as of 09/06/23 0412  Fri Sep 06, 2023  0256 Urinalysis, Routine w reflex microscopic -Urine, Clean Catch(!) Worrisome for infection, will give dose of IV Rocephin [RB]  0256 Basic metabolic panel(!) Baseline CKD and elevated creatinine [RB]  0256 CBC with Differential(!) No significant leukocytosis [RB]  0256 Feeling much better after replacement of Foley catheter.  New catheter is draining well. [RB]    Clinical Course User Index [RB] Roxy Horseman, PA-C    Medical Decision Making Patient here with clogged Foley catheter.  He has not had any output tonight.  He appears uncomfortable.  Catheter was exchanged by the RN.  Patient feels significantly improved now.  Vitals have normalized.  Urinalysis is worrisome for infection.  Will give loading dose of Rocephin.  Will discharge home on Keflex.  Patient is encouraged to follow-up with his doctors.  He does not appear septic or toxic.  Feel that he is stable for outpatient treatment.  Amount  and/or Complexity of Data Reviewed Labs: ordered. Decision-making details documented in ED Course.  Risk Prescription drug management.         Consultants: No consultations were needed in caring for this patient.   Treatment and Plan: I considered admission due to patient's initial presentation, but after considering the examination and diagnostic results, patient will not require admission and can be discharged with outpatient follow-up.    Final Clinical Impressions(s) / ED Diagnoses     ICD-10-CM   1. Urinary tract infection associated with indwelling urethral catheter, initial encounter (HCC)  T83.511A    N39.0     2. Urinary retention  R33.9       ED Discharge Orders          Ordered    cephALEXin (KEFLEX) 500 MG capsule  2 times daily        09/06/23 0411              Discharge Instructions Discussed with and Provided to Patient:   Discharge Instructions   None      Roxy Horseman, PA-C 09/06/23 9604    Zadie Rhine, MD 09/06/23 [phone]

## 2023-09-06 NOTE — ED Triage Notes (Addendum)
 Patient states he thinks his foley catheter is clogged and it hasn't been draining since this afternoon. Complaining of spasms in abdomen. Bladder scan .

## 2023-10-30 ENCOUNTER — Telehealth: Payer: Self-pay

## 2023-10-30 NOTE — Telephone Encounter (Signed)
 Called twice to confirm appt with Dr. Pasam for tomorrow at 1000 at [facility] location with no answer and no voicemail. Backup person has same phone number.

## 2023-10-31 ENCOUNTER — Telehealth: Payer: Self-pay | Admitting: Oncology

## 2023-10-31 ENCOUNTER — Encounter: Payer: Self-pay | Admitting: *Deleted

## 2023-10-31 ENCOUNTER — Inpatient Hospital Stay

## 2023-10-31 ENCOUNTER — Encounter: Payer: Self-pay | Admitting: Oncology

## 2023-10-31 ENCOUNTER — Telehealth: Payer: Self-pay

## 2023-10-31 ENCOUNTER — Inpatient Hospital Stay: Attending: Oncology | Admitting: Oncology

## 2023-10-31 VITALS — BP 122/82 | HR 74 | Temp 97.9°F | Resp 17 | Ht 67.0 in | Wt 128.2 lb

## 2023-10-31 DIAGNOSIS — N1832 Chronic kidney disease, stage 3b: Secondary | ICD-10-CM | POA: Diagnosis not present

## 2023-10-31 DIAGNOSIS — E43 Unspecified severe protein-calorie malnutrition: Secondary | ICD-10-CM

## 2023-10-31 DIAGNOSIS — D472 Monoclonal gammopathy: Secondary | ICD-10-CM | POA: Diagnosis not present

## 2023-10-31 DIAGNOSIS — E1122 Type 2 diabetes mellitus with diabetic chronic kidney disease: Secondary | ICD-10-CM | POA: Insufficient documentation

## 2023-10-31 DIAGNOSIS — K759 Inflammatory liver disease, unspecified: Secondary | ICD-10-CM | POA: Diagnosis not present

## 2023-10-31 DIAGNOSIS — I129 Hypertensive chronic kidney disease with stage 1 through stage 4 chronic kidney disease, or unspecified chronic kidney disease: Secondary | ICD-10-CM | POA: Diagnosis not present

## 2023-10-31 DIAGNOSIS — Z122 Encounter for screening for malignant neoplasm of respiratory organs: Secondary | ICD-10-CM

## 2023-10-31 DIAGNOSIS — F17210 Nicotine dependence, cigarettes, uncomplicated: Secondary | ICD-10-CM | POA: Insufficient documentation

## 2023-10-31 DIAGNOSIS — N320 Bladder-neck obstruction: Secondary | ICD-10-CM | POA: Insufficient documentation

## 2023-10-31 LAB — CMP (CANCER CENTER ONLY)
ALT: 13 U/L (ref 0–44)
AST: 18 U/L (ref 15–41)
Albumin: 4.7 g/dL (ref 3.5–5.0)
Alkaline Phosphatase: 52 U/L (ref 38–126)
Anion gap: 4 — ABNORMAL LOW (ref 5–15)
BUN: 32 mg/dL — ABNORMAL HIGH (ref 8–23)
CO2: 31 mmol/L (ref 22–32)
Calcium: 10 mg/dL (ref 8.9–10.3)
Chloride: 103 mmol/L (ref 98–111)
Creatinine: 2.29 mg/dL — ABNORMAL HIGH (ref 0.61–1.24)
GFR, Estimated: 30 mL/min — ABNORMAL LOW (ref >60)
Glucose, Bld: 97 mg/dL (ref 70–99)
Potassium: 4.4 mmol/L (ref 3.5–5.1)
Sodium: 138 mmol/L (ref 135–145)
Total Bilirubin: 0.3 mg/dL (ref 0.0–1.2)
Total Protein: 8.1 g/dL (ref 6.5–8.1)

## 2023-10-31 LAB — CBC WITH DIFFERENTIAL (CANCER CENTER ONLY)
Abs Immature Granulocytes: 0.02 10*3/uL (ref 0.00–0.07)
Basophils Absolute: 0.1 10*3/uL (ref 0.0–0.1)
Basophils Relative: 1 %
Eosinophils Absolute: 0.1 10*3/uL (ref 0.0–0.5)
Eosinophils Relative: 3 %
HCT: 36.8 % — ABNORMAL LOW (ref 39.0–52.0)
Hemoglobin: 12.3 g/dL — ABNORMAL LOW (ref 13.0–17.0)
Immature Granulocytes: 0 %
Lymphocytes Relative: 34 %
Lymphs Abs: 1.7 10*3/uL (ref 0.7–4.0)
MCH: 31.7 pg (ref 26.0–34.0)
MCHC: 33.4 g/dL (ref 30.0–36.0)
MCV: 94.8 fL (ref 80.0–100.0)
Monocytes Absolute: 0.4 10*3/uL (ref 0.1–1.0)
Monocytes Relative: 8 %
Neutro Abs: 2.7 10*3/uL (ref 1.7–7.7)
Neutrophils Relative %: 54 %
Platelet Count: 269 10*3/uL (ref 150–400)
RBC: 3.88 MIL/uL — ABNORMAL LOW (ref 4.22–5.81)
RDW: 13.6 % (ref 11.5–15.5)
WBC Count: 5.1 10*3/uL (ref 4.0–10.5)
nRBC: 0 % (ref 0.0–0.2)

## 2023-10-31 LAB — URIC ACID: Uric Acid, Serum: 6.3 mg/dL (ref 3.7–8.6)

## 2023-10-31 LAB — LACTATE DEHYDROGENASE: LDH: 167 U/L (ref 98–192)

## 2023-10-31 LAB — HEPATITIS PANEL, ACUTE
HCV Ab: NONREACTIVE
Hep A IgM: NONREACTIVE
Hep B C IgM: NONREACTIVE
Hepatitis B Surface Ag: NONREACTIVE

## 2023-10-31 NOTE — Telephone Encounter (Signed)
 William Bradshaw called in to update his contact number.

## 2023-10-31 NOTE — Telephone Encounter (Signed)
 [facility] Clinical Social Work  Clinical Social Work was referred by nurse for assessment of psychosocial needs (SDOH).  Clinical Social Worker attempted to contact patient by phone to offer support and assess for needs. CSW attempted both numbers on chart. Unable to leave VM. Will attempt again  Maudie Sorrow, LCSW  Clinical Social Worker [facility]

## 2023-10-31 NOTE — Progress Notes (Signed)
 Bag of feed given to patient

## 2023-10-31 NOTE — Progress Notes (Signed)
 [facility]  HEMATOLOGY CLINIC CONSULTATION NOTE   PATIENT NAME: William Bradshaw   MR#: [id number] DOB: Feb 03, 1956  DATE OF SERVICE: 10/31/2023   REFERRING PROVIDER  Charley Constable, MD, Nephrology   Patient Care Team: Ulysees Gander, MD as PCP - General (Family Medicine) Charley Constable, MD as Consulting Physician (Nephrology) Samson Croak, MD as Consulting Physician (Urology) Arlo Berber, MD as Consulting Physician (Oncology)   REASON FOR CONSULTATION/ CHIEF COMPLAINT:  Evaluation for monoclonal gammopathy  ASSESSMENT & PLAN:  William Bradshaw is a 68 y.o. 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 Previous immunofixation unremarkable with no monoclonal protein detected. Free kappa and lambda levels were both elevated with normal ratio, consistent with CKD picture.  Further testing required to confirm diagnosis and rule out progression to multiple myeloma. - Order SPEP, IFE, quantitative immunoglobulins, and serum free light chains - Request 24-hour urine protein electrophoresis and immunoelectrophoresis - Provide instructions for 24-hour urine collection  Labs today showed creatinine of 2.29, calcium of 10.  Hemoglobin stable at 12.3.  White count and platelet count are within normal limits.  Will follow-up on the results and determine further course of action.  Most likely will continue surveillance.  CKD stage 3b, GFR 30-44 ml/min (HCC) CKD Stage 3B with creatinine levels fluctuating between 1.95 and 3.78. Current creatinine at 2.29. Referred to nephrology for renal dysfunction. Ongoing evaluation to determine underlying causes, including  obstructive uropathy from bladder outlet obstruction.  Bladder outlet obstruction Bladder outlet obstruction with scheduled surgical intervention on May 7th. Managed with a Foley catheter. Instructed on 24-hour urine collection despite Foley catheter. - Proceed with scheduled surgery on May 7th - Provide instructions for 24-hour urine collection despite Foley catheter  I will arrange for a phone call visit to discuss results in approximately 3 weeks.  Plan to see him again in 4 months with labs 2 weeks prior to return visit.  I have reviewed lab results and outside records for this visit and discussed relevant results with the patient. Diagnosis, plan of care and treatment options were also discussed in detail with the patient. Opportunity provided to ask questions and answers provided to his apparent satisfaction. Provided instructions to call our clinic with any problems, questions or concerns prior to return visit. I recommended to continue follow-up with PCP and sub-specialists. He verbalized understanding and agreed with the plan. No barriers to learning was detected.  Arlo Berber, MD  10/31/2023 10:40 AM  [facility] - A DEPT OF [facility] [address] [place] [address] Dept: [phone] Dept Fax: [phone]   HISTORY OF PRESENT ILLNESS:  Discussed the use of AI scribe software for clinical note transcription with the patient, who gave verbal consent to proceed.  History of Present Illness William Bradshaw is a 68 year old male with chronic kidney disease stage 3B and MGUS who presents for further evaluation of renal dysfunction. He was referred by a nephrologist for further evaluation of renal dysfunction.  He has a history of chronic kidney disease (CKD) stage 3B, with creatinine levels peaking at 3.78 in October 2024 and currently at 2.29 as of September 20, 2023. His  kidney function was noted to be decreased  during a hospital visit in October 2024, leading to a referral to nephrology in December 2024 for renal dysfunction.  He has been diagnosed with monoclonal gammopathy of undetermined significance (MGUS) with an M spike of 0.7 g/dL noted in December 4259. Previous immunofixation was unremarkable, and there was no apparent monoclonal protein. Free kappa was 54.5 mg/L, lambda was 50.2 mg/L, with a nominal ratio of 1.09.  He has a history of hypertension and diabetes mellitus type 2. He started taking blood pressure medication within the last two to three years. His diabetes was previously considered borderline, and he was not on medication for it.  He has a history of polysubstance abuse and smokes cigarettes, particularly when drinking alcohol. He has reduced his alcohol intake from a twelve-pack a day to one or two beers a day and has also cut back on smoking.  He has a history of bladder outlet obstruction and is scheduled for surgery on Nov 06, 2023. He currently uses a Foley catheter.  He has a history of hepatitis, which was identified when he was unable to donate blood. He has not received treatment for it.  He has experienced weight loss due to decreased appetite but has recently gained weight, increasing from 114 pounds to 128 pounds.  He has a history of CKD stage III.  He has diabetes, hypertension, obstructive uropathy.  He was found to have M spike of 0.7 g/dL  He denies fever, cough, diarrhea, or other infectious symptoms.  He denies epistaxis, bloody stool, melena, hematuria, bruising or other bleeding symptoms. He also denies unintentional weight loss, night sweats or other constitutional symptoms.  MEDICAL HISTORY Past Medical History:  Diagnosis Date   CKD stage 3b, GFR 30-44 ml/min (HCC)    Diabetes mellitus (HCC)    Hypertension    Obstructive uropathy      SURGICAL HISTORY History reviewed. No pertinent surgical history.   SOCIAL HISTORY: He reports that he has been  smoking cigarettes. He does not have any smokeless tobacco history on file. He reports current alcohol use. He reports that he does not currently use drugs after having used the following drugs: Cocaine. Social History   Socioeconomic History   Marital status: Married    Spouse name: Not on file   Number of children: Not on file   Years of education: Not on file   Highest education level: Not on file  Occupational History   Not on file  Tobacco Use   Smoking status: Every Day    Current packs/day: 1.00    Types: Cigarettes   Smokeless tobacco: Not on file  Substance and Sexual Activity   Alcohol use: Yes    Comment: 2 beers/ day   Drug use: Not Currently    Types: Cocaine   Sexual activity: Not on file  Other Topics Concern   Not on file  Social History Narrative   Not on file   Social Drivers of Health   Financial Resource Strain: Not on file  Food Insecurity: Food Insecurity Present (10/31/2023)   Hunger Vital Sign    Worried About Running Out of Food in the Last Year: Sometimes true    Ran Out of Food in the Last Year: Sometimes true  Transportation Needs: No Transportation Needs (10/31/2023)   PRAPARE - Administrator, Civil Service (Medical): No    Lack of Transportation (Non-Medical): No  Physical Activity: Not on file  Stress: Not on  file  Social Connections: Not on file  Intimate Partner Violence: Not At Risk (10/31/2023)   Humiliation, Afraid, Rape, and Kick questionnaire    Fear of Current or Ex-Partner: No    Emotionally Abused: No    Physically Abused: No    Sexually Abused: No    FAMILY HISTORY: His family history is not on file.  CURRENT MEDICATIONS   Current Outpatient Medications  Medication Instructions   cephALEXin  (KEFLEX ) 500 mg, Oral, 2 times daily   finasteride  (PROSCAR ) 5 mg, Oral, Daily   NIFEdipine  (PROCARDIA -XL/NIFEDICAL-XL) 30 mg, Daily   oxybutynin  (DITROPAN ) 5 mg, Oral, 3 times daily PRN   polyethylene glycol (MIRALAX  /  GLYCOLAX ) 17 g, Oral, Daily PRN   tamsulosin  (FLOMAX ) 0.4 mg, Oral, Daily     ALLERGIES  He has no known allergies.  REVIEW OF SYSTEMS:  Review of Systems - Oncology   Rest of the pertinent review of systems is unremarkable except as mentioned above in HPI.  PHYSICAL EXAMINATION:   Onc Performance Status - 10/31/23 1004       ECOG Perf Status   ECOG Perf Status Restricted in physically strenuous activity but ambulatory and able to carry out work of a light or sedentary nature, e.g., light house work, office work      KPS SCALE   KPS % SCORE Normal activity with effort, some s/s of disease             Vitals:   10/31/23 0953  BP: 122/82  Pulse: 74  Resp: 17  Temp: 97.9 F (36.6 C)  SpO2: 98%   Filed Weights   10/31/23 0953  Weight: 128 lb 3 oz (58.1 kg)    Physical Exam Constitutional:      General: He is not in acute distress.    Appearance: Normal appearance.  HENT:     Head: Normocephalic and atraumatic.  Eyes:     General: No scleral icterus.    Conjunctiva/sclera: Conjunctivae normal.  Cardiovascular:     Rate and Rhythm: Normal rate and regular rhythm.     Heart sounds: Normal heart sounds.  Pulmonary:     Effort: Pulmonary effort is normal.     Breath sounds: Normal breath sounds.  Abdominal:     General: There is no distension.  Musculoskeletal:     Right lower leg: No edema.     Left lower leg: No edema.  Neurological:     General: No focal deficit present.     Mental Status: He is alert and oriented to person, place, and time.  Psychiatric:        Mood and Affect: Mood normal.        Behavior: Behavior normal.        Thought Content: Thought content normal.     LABORATORY DATA:   I have reviewed the data as listed.  Results for orders placed or performed in visit on 10/31/23  Hepatitis panel, acute  Result Value Ref Range   Hepatitis B Surface Ag NON REACTIVE NON REACTIVE   HCV Ab NON REACTIVE NON REACTIVE   Hep A IgM NON  REACTIVE NON REACTIVE   Hep B C IgM NON REACTIVE NON REACTIVE  Beta 2 microglobulin, serum  Result Value Ref Range   Beta-2 Microglobulin 3.7 (H) 0.6 - 2.4 mg/L  Uric acid  Result Value Ref Range   Uric Acid, Serum 6.3 3.7 - 8.6 mg/dL  Lactate dehydrogenase  Result Value Ref Range   LDH 167 98 -  192 U/L  Kappa/lambda light chains  Result Value Ref Range   Kappa free light chain 49.7 (H) 3.3 - 19.4 mg/L   Lambda free light chains 47.4 (H) 5.7 - 26.3 mg/L   Kappa, lambda light chain ratio 1.05 0.26 - 1.65  CMP (Cancer Center only)  Result Value Ref Range   Sodium 138 135 - 145 mmol/L   Potassium 4.4 3.5 - 5.1 mmol/L   Chloride 103 98 - 111 mmol/L   CO2 31 22 - 32 mmol/L   Glucose, Bld 97 70 - 99 mg/dL   BUN 32 (H) 8 - 23 mg/dL   Creatinine 1.61 (H) 0.96 - 1.24 mg/dL   Calcium 04.5 8.9 - 40.9 mg/dL   Total Protein 8.1 6.5 - 8.1 g/dL   Albumin 4.7 3.5 - 5.0 g/dL   AST 18 15 - 41 U/L   ALT 13 0 - 44 U/L   Alkaline Phosphatase 52 38 - 126 U/L   Total Bilirubin 0.3 0.0 - 1.2 mg/dL   GFR, Estimated 30 (L) >60 mL/min   Anion gap 4 (L) 5 - 15  CBC with Differential (Cancer Center Only)  Result Value Ref Range   WBC Count 5.1 4.0 - 10.5 K/uL   RBC 3.88 (L) 4.22 - 5.81 MIL/uL   Hemoglobin 12.3 (L) 13.0 - 17.0 g/dL   HCT 81.1 (L) 91.4 - 78.2 %   MCV 94.8 80.0 - 100.0 fL   MCH 31.7 26.0 - 34.0 pg   MCHC 33.4 30.0 - 36.0 g/dL   RDW 95.6 21.3 - 08.6 %   Platelet Count 269 150 - 400 K/uL   nRBC 0.0 0.0 - 0.2 %   Neutrophils Relative % 54 %   Neutro Abs 2.7 1.7 - 7.7 K/uL   Lymphocytes Relative 34 %   Lymphs Abs 1.7 0.7 - 4.0 K/uL   Monocytes Relative 8 %   Monocytes Absolute 0.4 0.1 - 1.0 K/uL   Eosinophils Relative 3 %   Eosinophils Absolute 0.1 0.0 - 0.5 K/uL   Basophils Relative 1 %   Basophils Absolute 0.1 0.0 - 0.1 K/uL   Immature Granulocytes 0 %   Abs Immature Granulocytes 0.02 0.00 - 0.07 K/uL    RADIOGRAPHIC STUDIES:  No pertinent imaging studies available to  review.  Orders Placed This Encounter  Procedures   CT CHEST LUNG CA SCREEN LOW DOSE W/O CM    Standing Status:   Future    Expected Date:   11/14/2023    Expiration Date:   10/30/2024    Preferred Imaging Location?:   [facility]   CBC with Differential (Cancer Center Only)    Standing Status:   Future    Expiration Date:   10/30/2024   CMP (Cancer Center only)    Standing Status:   Future    Expiration Date:   10/30/2024   Kappa/lambda light chains    Standing Status:   Future    Expiration Date:   10/30/2024   Multiple Myeloma Panel (SPEP&IFE w/QIG)    Standing Status:   Future    Expiration Date:   10/30/2024   UPEP/UIFE/Light Chains/TP, 24-Hr Ur    Standing Status:   Future    Expiration Date:   10/30/2024   Lactate dehydrogenase    Standing Status:   Future    Expiration Date:   10/30/2024   Uric acid    Standing Status:   Future    Expiration Date:   10/30/2024  Beta 2 microglobulin, serum    Standing Status:   Future    Expiration Date:   10/30/2024   Hepatitis panel, acute    Standing Status:   Future    Expiration Date:   10/30/2024   Ambulatory referral to Social Work    Referral Priority:   Routine    Referral Type:   Consultation    Referral Reason:   Specialty Services Required    Number of Visits Requested:   1    Future Appointments  Date Time Provider Department Center  11/21/2023 11:30 AM Zian Delair, Gale Jude, MD [facility] None  02/19/2024  1:15 PM [facility] None  03/04/2024  2:30 PM Nero Sawatzky, MD [facility] None    I spent a total of 55 minutes during this encounter with the patient including review of chart and various tests results, discussions about plan of care and coordination of care plan.  This document was completed utilizing speech recognition software. Grammatical errors, random word insertions, pronoun errors, and incomplete sentences are an occasional consequence of this system due to software limitations, ambient noise, and  hardware issues. Any formal questions or concerns about the content, text or information contained within the body of this dictation should be directly addressed to the provider for clarification.

## 2023-11-01 ENCOUNTER — Encounter: Payer: Self-pay | Admitting: Oncology

## 2023-11-01 DIAGNOSIS — N320 Bladder-neck obstruction: Secondary | ICD-10-CM | POA: Insufficient documentation

## 2023-11-01 DIAGNOSIS — N1832 Chronic kidney disease, stage 3b: Secondary | ICD-10-CM | POA: Insufficient documentation

## 2023-11-01 DIAGNOSIS — Z122 Encounter for screening for malignant neoplasm of respiratory organs: Secondary | ICD-10-CM | POA: Insufficient documentation

## 2023-11-01 DIAGNOSIS — D472 Monoclonal gammopathy: Secondary | ICD-10-CM | POA: Insufficient documentation

## 2023-11-01 LAB — KAPPA/LAMBDA LIGHT CHAINS
Kappa free light chain: 49.7 mg/L — ABNORMAL HIGH (ref 3.3–19.4)
Kappa, lambda light chain ratio: 1.05 (ref 0.26–1.65)
Lambda free light chains: 47.4 mg/L — ABNORMAL HIGH (ref 5.7–26.3)

## 2023-11-01 LAB — BETA 2 MICROGLOBULIN, SERUM: Beta-2 Microglobulin: 3.7 mg/L — ABNORMAL HIGH (ref 0.6–2.4)

## 2023-11-01 NOTE — Assessment & Plan Note (Addendum)
 Clinical picture consistent with MGUS. Previous immunofixation unremarkable with no monoclonal protein detected. Free kappa and lambda levels were both elevated with normal ratio, consistent with CKD picture.  Further testing required to confirm diagnosis and rule out progression to multiple myeloma. - Order SPEP, IFE, quantitative immunoglobulins, and serum free light chains - Request 24-hour urine protein electrophoresis and immunoelectrophoresis - Provide instructions for 24-hour urine collection  Labs today showed creatinine of 2.29, calcium of 10.  Hemoglobin stable at 12.3.  White count and platelet count are within normal limits.  Will follow-up on the results and determine further course of action.  Most likely will continue surveillance.

## 2023-11-01 NOTE — Assessment & Plan Note (Signed)
 CKD Stage 3B with creatinine levels fluctuating between 1.95 and 3.78. Current creatinine at 2.29. Referred to nephrology for renal dysfunction. Ongoing evaluation to determine underlying causes, including obstructive uropathy from bladder outlet obstruction.

## 2023-11-01 NOTE — Assessment & Plan Note (Signed)
 Bladder outlet obstruction with scheduled surgical intervention on May 7th. Managed with a Foley catheter. Instructed on 24-hour urine collection despite Foley catheter. - Proceed with scheduled surgery on May 7th - Provide instructions for 24-hour urine collection despite Foley catheter

## 2023-11-03 LAB — MULTIPLE MYELOMA PANEL, SERUM
Albumin SerPl Elph-Mcnc: 4.3 g/dL (ref 2.9–4.4)
Albumin/Glob SerPl: 1.4 (ref 0.7–1.7)
Alpha 1: 0.1 g/dL (ref 0.0–0.4)
Alpha2 Glob SerPl Elph-Mcnc: 0.7 g/dL (ref 0.4–1.0)
B-Globulin SerPl Elph-Mcnc: 1.2 g/dL (ref 0.7–1.3)
Gamma Glob SerPl Elph-Mcnc: 1.2 g/dL (ref 0.4–1.8)
Globulin, Total: 3.1 g/dL (ref 2.2–3.9)
IgA: 363 mg/dL (ref 61–437)
IgG (Immunoglobin G), Serum: 1361 mg/dL (ref 603–1613)
IgM (Immunoglobulin M), Srm: 92 mg/dL (ref 20–172)
M Protein SerPl Elph-Mcnc: 0.4 g/dL — ABNORMAL HIGH
Total Protein ELP: 7.4 g/dL (ref 6.0–8.5)

## 2023-11-04 ENCOUNTER — Telehealth: Payer: Self-pay

## 2023-11-04 NOTE — Telephone Encounter (Signed)
 [facility] Clinical Social Work  Clinical Social Work was referred by nurse for assessment of psychosocial needs.  Clinical Social Worker attempted to contact patient by phone to offer support and assess for needs. CSW attempted to reach patient at numbers listed on chart. No answer. Unable to leave VM. Referral will be close at this time.  Maudie Sorrow, LCSW  Clinical Social Worker [facility]

## 2023-11-20 ENCOUNTER — Ambulatory Visit (HOSPITAL_COMMUNITY)
Admission: RE | Admit: 2023-11-20 | Discharge: 2023-11-20 | Disposition: A | Discharge status: Home / Self Care | Source: Ambulatory Visit | Attending: Oncology | Admitting: Oncology

## 2023-11-20 DIAGNOSIS — N261 Atrophy of kidney (terminal): Secondary | ICD-10-CM | POA: Insufficient documentation

## 2023-11-20 DIAGNOSIS — F17210 Nicotine dependence, cigarettes, uncomplicated: Secondary | ICD-10-CM | POA: Insufficient documentation

## 2023-11-20 DIAGNOSIS — I700 Atherosclerosis of aorta: Secondary | ICD-10-CM | POA: Diagnosis not present

## 2023-11-20 DIAGNOSIS — Z122 Encounter for screening for malignant neoplasm of respiratory organs: Secondary | ICD-10-CM | POA: Diagnosis present

## 2023-11-20 DIAGNOSIS — I2510 Atherosclerotic heart disease of native coronary artery without angina pectoris: Secondary | ICD-10-CM | POA: Insufficient documentation

## 2023-11-20 DIAGNOSIS — J439 Emphysema, unspecified: Secondary | ICD-10-CM | POA: Insufficient documentation

## 2023-11-21 ENCOUNTER — Inpatient Hospital Stay: Admitting: Oncology

## 2023-11-21 ENCOUNTER — Encounter: Payer: Self-pay | Admitting: Oncology

## 2023-11-21 DIAGNOSIS — D472 Monoclonal gammopathy: Secondary | ICD-10-CM

## 2023-11-21 NOTE — Progress Notes (Signed)
 We could not complete a phone call visit as there was no answer despite multiple attempts.  Will see him as scheduled in September.

## 2023-11-21 NOTE — Progress Notes (Deleted)
 [facility]  HEMATOLOGY-ONCOLOGY ELECTRONIC VISIT PROGRESS NOTE  PATIENT NAME: William Bradshaw   MR#: [id number] DOB: 06-07-1956  DATE OF SERVICE: 11/21/2023  Patient Care Team: Ulysees Gander, MD as PCP - General (Family Medicine) Charley Constable, MD as Consulting Physician (Nephrology) Samson Croak, MD as Consulting Physician (Urology) Arlo Berber, MD as Consulting Physician (Oncology)  I connected with the patient via telephone conference and verified that I am speaking with the correct person using two identifiers. The patient's location is at home and I am providing care from the [facility].  I discussed the limitations, risks, security and privacy concerns of performing an evaluation and management service by e-visits and the availability of in person appointments. I also discussed with the patient that there may be a patient responsible charge related to this service. The patient expressed understanding and agreed to proceed.   ASSESSMENT & PLAN:   William Bradshaw is a 68 y.o.  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    No problem-specific Assessment & Plan notes found for this encounter.   Assessment and Plan Assessment & Plan       I discussed the assessment and treatment plan with the patient. The patient was provided an opportunity to ask questions and all were answered. The patient agreed with the plan and demonstrated an understanding of the instructions. The patient was advised to call back or seek an in-person evaluation if the symptoms worsen or if the condition fails to improve as anticipated.    I spent *** minutes over the phone with the patient reviewing test results, discuss management and  coordination/planning of care.  Arlo Berber, MD 11/21/2023 10:00 AM [facility] - A DEPT OF [facility] [address] [place] [address] Dept: [phone] Dept Fax: [phone]   INTERVAL HISTORY:  Please see above for problem oriented charting.  The purpose of today's discussion is to explain recent lab results and to formulate plan of care.  Discussed the use of AI scribe software for clinical note transcription with the patient, who gave verbal consent to proceed.  History of Present Illness      ***  SUMMARY OF HEMATOLOGY HISTORY:  He has a history of chronic kidney disease (CKD) stage 3B, with creatinine levels peaking at 3.78 in October 2024 and currently at 2.29 as of September 20, 2023. His kidney function was noted to be decreased during a hospital visit in October 2024, leading to a referral to nephrology in December 2024 for renal dysfunction.   He has been diagnosed with monoclonal gammopathy of undetermined significance (MGUS) with an M spike of 0.7 g/dL noted in December 6962. Previous immunofixation was unremarkable, and there was no apparent monoclonal protein. Free kappa was 54.5 mg/L, lambda was 50.2 mg/L, with a nominal ratio of 1.09.   He has a history of hypertension and diabetes mellitus type 2. He started taking blood pressure medication within the last two to three years. His diabetes was previously considered borderline, and he was not on medication for it.   He has a history of polysubstance abuse and smokes cigarettes, particularly when drinking alcohol. He has reduced his alcohol intake from a twelve-pack a day to one or  two beers a day and has also cut back on smoking.   He has a history of bladder outlet obstruction and is scheduled for surgery on Nov 06, 2023. He currently uses a Foley catheter.   He has a history of hepatitis, which was identified when he was unable to donate  blood. He has not received treatment for it.   He has experienced weight loss due to decreased appetite but has recently gained weight, increasing from 114 pounds to 128 pounds.   He has a history of CKD stage III.  He has diabetes, hypertension, obstructive uropathy.  He was found to have M spike of 0.7 g/dL   On initial consultation with us  on 10/31/2023, labs showed hemoglobin of 12.3, stable.  White count and platelet count were within normal limits.  Creatinine 2.29, calcium 10.  SPEP showed 0.4 g/dL of M protein.  LDH and uric acid were within normal limits.  IFE showed it to be IgG lambda type.  Quantitative hemoglobins were within normal limits.  Both serum free kappa and serum free lambda were elevated at 49.7 mg/L and 47.4 mg/L respectively, with normal ratio of 1.05.  Beta-2  microglobulin was 3.7 mg/L.  We did request 24-hour urine protein electrophoresis and immunoelectrophoresis but patient did not return the sample yet.  Clinical picture is consistent with MGUS at this time.  REVIEW OF SYSTEMS:    Review of Systems - Oncology  All other pertinent systems were reviewed with the patient and are negative.  I have reviewed the past medical history, past surgical history, social history and family history with the patient and they are unchanged from previous note.  ALLERGIES:  He has no known allergies.  MEDICATIONS:  Current Outpatient Medications  Medication Sig Dispense Refill   cephALEXin  (KEFLEX ) 500 MG capsule Take 1 capsule (500 mg total) by mouth 2 (two) times daily. (Patient not taking: Reported on 10/31/2023) 14 capsule 0   finasteride  (PROSCAR ) 5 MG tablet Take 1 tablet (5 mg total) by mouth daily. 30 tablet 3   NIFEdipine  (PROCARDIA -XL/NIFEDICAL-XL) 30 MG 24 hr tablet Take 30 mg by mouth daily. (Patient not taking: Reported on 10/31/2023)     oxybutynin  (DITROPAN ) 5 MG tablet Take 1 tablet (5 mg total) by mouth 3 (three) times daily as needed for bladder spasms. (Patient  not taking: Reported on 10/31/2023) 20 tablet 0   polyethylene glycol (MIRALAX  / GLYCOLAX ) 17 g packet Take 17 g by mouth daily as needed for mild constipation. 14 each 0   tamsulosin  (FLOMAX ) 0.4 MG CAPS capsule Take 1 capsule (0.4 mg total) by mouth daily. 90 capsule 3   No current facility-administered medications for this visit.    PHYSICAL EXAMINATION:  ***   LABORATORY DATA:   I have reviewed the data as listed.  Recent Results (from the past 2160 hours)  Urinalysis, Routine w reflex microscopic -Urine, Clean Catch     Status: Abnormal   Collection Time: 09/06/23  2:14 AM  Result Value Ref Range   Color, Urine YELLOW YELLOW   APPearance HAZY (A) CLEAR   Specific Gravity, Urine 1.013 1.005 - 1.030   pH 7.0 5.0 - 8.0   Glucose, UA 50 (A) NEGATIVE mg/dL   Hgb urine dipstick MODERATE (A) NEGATIVE   Bilirubin Urine NEGATIVE NEGATIVE   Ketones, ur NEGATIVE NEGATIVE mg/dL   Protein, ur 30 (A) NEGATIVE mg/dL   Nitrite POSITIVE (A) NEGATIVE   Leukocytes,Ua LARGE (A) NEGATIVE   RBC / HPF >50 0 -  5 RBC/hpf   WBC, UA >50 0 - 5 WBC/hpf   Bacteria, UA FEW (A) NONE SEEN   Squamous Epithelial / HPF 0-5 0 - 5 /HPF   WBC Clumps PRESENT     Comment: Performed at [facility], [address], [place], [place] [address]  CBC with Differential     Status: Abnormal   Collection Time: 09/06/23  2:14 AM  Result Value Ref Range   WBC 6.2 4.0 - 10.5 K/uL   RBC 3.82 (L) 4.22 - 5.81 MIL/uL   Hemoglobin 12.0 (L) 13.0 - 17.0 g/dL   HCT 19.1 (L) 47.8 - 29.5 %   MCV 92.4 80.0 - 100.0 fL   MCH 31.4 26.0 - 34.0 pg   MCHC 34.0 30.0 - 36.0 g/dL   RDW 62.1 30.8 - 65.7 %   Platelets 299 150 - 400 K/uL   nRBC 0.0 0.0 - 0.2 %   Neutrophils Relative % 85 %   Neutro Abs 5.3 1.7 - 7.7 K/uL   Lymphocytes Relative 9 %   Lymphs Abs 0.6 (L) 0.7 - 4.0 K/uL   Monocytes Relative 5 %   Monocytes Absolute 0.3 0.1 - 1.0 K/uL   Eosinophils Relative 0 %   Eosinophils Absolute 0.0 0.0 - 0.5 K/uL    Basophils Relative 0 %   Basophils Absolute 0.0 0.0 - 0.1 K/uL   Immature Granulocytes 1 %   Abs Immature Granulocytes 0.03 0.00 - 0.07 K/uL    Comment: Performed at [facility], [address], [place], [place] [address]  Basic metabolic panel     Status: Abnormal   Collection Time: 09/06/23  2:14 AM  Result Value Ref Range   Sodium 135 135 - 145 mmol/L   Potassium 4.9 3.5 - 5.1 mmol/L    Comment: HEMOLYSIS AT THIS LEVEL MAY AFFECT RESULT   Chloride 100 98 - 111 mmol/L   CO2 23 22 - 32 mmol/L   Glucose, Bld 132 (H) 70 - 99 mg/dL    Comment: Glucose reference range applies only to samples taken after fasting for at least 8 hours.   BUN 30 (H) 8 - 23 mg/dL   Creatinine, Ser 2.95 (H) 0.61 - 1.24 mg/dL   Calcium 9.3 8.9 - 28.4 mg/dL   GFR, Estimated 29 (L) >60 mL/min    Comment: (NOTE) Calculated using the CKD-EPI Creatinine Equation (2021)    Anion gap 12 5 - 15    Comment: Performed at [facility], [address], [place], [place] [address]  Hepatitis panel, acute     Status: None   Collection Time: 10/31/23 11:07 AM  Result Value Ref Range   Hepatitis B Surface Ag NON REACTIVE NON REACTIVE   HCV Ab NON REACTIVE NON REACTIVE    Comment: (NOTE) Nonreactive HCV antibody screen is consistent with no HCV infections,  unless recent infection is suspected or other evidence exists to indicate HCV infection.     Hep A IgM NON REACTIVE NON REACTIVE   Hep B C IgM NON REACTIVE NON REACTIVE    Comment: Performed at [facility], [address], [place], [place] [address]  Beta 2 microglobulin, serum     Status: Abnormal   Collection Time: 10/31/23 11:07 AM  Result Value Ref Range   Beta-2  Microglobulin 3.7 (H) 0.6 - 2.4 mg/L    Comment: (NOTE) Siemens Immulite 2000 Immunochemiluminometric assay (ICMA) Values obtained with different assay methods or kits cannot be used  interchangeably. Results cannot be interpreted as absolute evidence of the presence or absence of  malignant disease. Performed At: [facility] [address] [place], [place] [id number] Pearlean Botts MD CZ:[id number]   Uric acid     Status: None   Collection Time: 10/31/23 11:07 AM  Result Value Ref Range   Uric Acid, Serum 6.3 3.7 - 8.6 mg/dL    Comment: Performed at [facility], [address] [place], [place] [address]  Kappa/lambda light chains     Status: Abnormal   Collection Time: 10/31/23 11:07 AM  Result Value Ref Range   Kappa free light chain 49.7 (H) 3.3 - 19.4 mg/L   Lambda free light chains 47.4 (H) 5.7 - 26.3 mg/L   Kappa, lambda light chain ratio 1.05 0.26 - 1.65    Comment: (NOTE) Performed At: [facility] [address] [place], [place] [id number] Pearlean Botts MD [id number]   CMP (Cancer Center only)     Status: Abnormal   Collection Time: 10/31/23 11:07 AM  Result Value Ref Range   Sodium 138 135 - 145 mmol/L   Potassium 4.4 3.5 - 5.1 mmol/L   Chloride 103 98 - 111 mmol/L   CO2 31 22 - 32 mmol/L   Glucose, Bld 97 70 - 99 mg/dL    Comment: Glucose reference range applies only to samples taken after fasting for at least 8 hours.   BUN 32 (H) 8 - 23 mg/dL   Creatinine 1.51 (H) 7.61 - 1.24 mg/dL   Calcium 60.7 8.9 - 37.1 mg/dL   Total Protein 8.1 6.5 - 8.1 g/dL   Albumin 4.7 3.5 - 5.0 g/dL   AST 18 15 - 41 U/L   ALT 13 0 - 44 U/L   Alkaline Phosphatase 52 38 - 126 U/L   Total Bilirubin 0.3 0.0 - 1.2 mg/dL   GFR, Estimated 30 (L) >60 mL/min    Comment: (NOTE) Calculated using the CKD-EPI Creatinine Equation (2021)    Anion gap 4 (L) 5 - 15    Comment: Performed at [facility], [address], [place], [place] [address]  CBC with Differential (Cancer Center Only)     Status: Abnormal   Collection Time: 10/31/23 11:07 AM  Result Value Ref Range   WBC Count 5.1 4.0 - 10.5 K/uL   RBC 3.88 (L) 4.22 - 5.81 MIL/uL   Hemoglobin 12.3 (L) 13.0 - 17.0 g/dL   HCT 48.5 (L) 46.2 - 70.3  %   MCV 94.8 80.0 - 100.0 fL   MCH 31.7 26.0 - 34.0 pg   MCHC 33.4 30.0 - 36.0 g/dL   RDW 50.0 93.8 - 18.2 %   Platelet Count 269 150 - 400 K/uL   nRBC 0.0 0.0 - 0.2 %   Neutrophils Relative % 54 %   Neutro Abs 2.7 1.7 - 7.7 K/uL   Lymphocytes Relative 34 %   Lymphs Abs 1.7 0.7 - 4.0 K/uL   Monocytes Relative 8 %   Monocytes Absolute 0.4 0.1 - 1.0 K/uL   Eosinophils Relative 3 %   Eosinophils Absolute 0.1 0.0 - 0.5 K/uL   Basophils Relative 1 %   Basophils Absolute 0.1 0.0 - 0.1 K/uL   Immature Granulocytes 0 %   Abs Immature Granulocytes 0.02 0.00 - 0.07 K/uL    Comment: Performed at [facility], [address], [place], [place] [address]  Lactate dehydrogenase     Status: None  Collection Time: 10/31/23 11:08 AM  Result Value Ref Range   LDH 167 98 - 192 U/L    Comment: Performed at [facility], [address], [place], [place] [address]  Multiple Myeloma Panel (SPEP&IFE w/QIG)     Status: Abnormal   Collection Time: 10/31/23 11:08 AM  Result Value Ref Range   IgG (Immunoglobin G), Serum 1,361 603 - 1,613 mg/dL   IgA 401 61 - 027 mg/dL   IgM (Immunoglobulin M), Srm 92 20 - 172 mg/dL   Total Protein ELP 7.4 6.0 - 8.5 g/dL   Albumin SerPl Elph-Mcnc 4.3 2.9 - 4.4 g/dL   Alpha 1 0.1 0.0 - 0.4 g/dL   Alpha2 Glob SerPl Elph-Mcnc 0.7 0.4 - 1.0 g/dL   B-Globulin SerPl Elph-Mcnc 1.2 0.7 - 1.3 g/dL   Gamma Glob SerPl Elph-Mcnc 1.2 0.4 - 1.8 g/dL   M Protein SerPl Elph-Mcnc 0.4 (H) Not Observed g/dL   Globulin, Total 3.1 2.2 - 3.9 g/dL   Albumin/Glob SerPl 1.4 0.7 - 1.7   IFE 1 Comment (A)     Comment: (NOTE) Immunofixation shows IgG monoclonal protein with lambda light chain specificity.    Please Note Comment     Comment: (NOTE) Protein electrophoresis scan will follow via computer, mail, or courier delivery. Performed At: [facility] [address] [place], [place] [id number] Pearlean Botts MD KV:[id number]       RADIOGRAPHIC STUDIES:  I have personally reviewed the radiological images as listed and agree with the findings in the report.  No results found.  *** No recent pertinent imaging studies available to review.  No orders of the defined types were placed in this encounter.    Future Appointments  Date Time Provider Department Center  11/21/2023 11:30 AM Macaela Presas, Gale Jude, MD [facility] None  02/19/2024  1:15 PM [facility] None  03/04/2024  2:30 PM Wei Poplaski, Gale Jude, MD [facility] None    This document was completed utilizing speech recognition software. Grammatical errors, random word insertions, pronoun errors, and incomplete sentences are an occasional consequence of this system due to software limitations, ambient noise, and hardware issues. Any formal questions or concerns about the content, text or information contained within the body of this dictation should be directly addressed to the provider for clarification.

## 2023-12-18 ENCOUNTER — Telehealth: Payer: Self-pay | Admitting: Oncology

## 2023-12-18 NOTE — Telephone Encounter (Signed)
 Attempted to contact patient for return call.

## 2024-02-05 ENCOUNTER — Encounter (INDEPENDENT_AMBULATORY_CARE_PROVIDER_SITE_OTHER): Payer: Self-pay | Admitting: Physician Assistant

## 2024-02-05 ENCOUNTER — Ambulatory Visit (INDEPENDENT_AMBULATORY_CARE_PROVIDER_SITE_OTHER): Admitting: Physician Assistant

## 2024-02-05 VITALS — BP 139/90 | HR 92

## 2024-02-05 DIAGNOSIS — H7292 Unspecified perforation of tympanic membrane, left ear: Secondary | ICD-10-CM

## 2024-02-05 MED ORDER — OFLOXACIN 0.3 % OT SOLN
5.0000 [drp] | Freq: Two times a day (BID) | OTIC | 0 refills | Status: AC
Start: 2024-02-05 — End: 2024-02-15

## 2024-02-05 NOTE — Progress Notes (Signed)
 Dear Dr. Maree, Here is my assessment for our mutual patient, William Bradshaw. Thank you for allowing me the opportunity to care for your patient. Please do not hesitate to contact me should you have any other questions. Sincerely, [name] Cohen PA-C  Otolaryngology Clinic Note Referring provider: Dr. Maree HPI:  William Bradshaw is a 68 y.o. male kindly referred by Dr. Maree   The patient is a William Bradshaw seen in our office for evaluation of left ear drainage.  The patient notes that for the last 5 years he has had decreased hearing out of his left ear, intermittent drainage from the left ear and an echoing sound in the ear.  He reports he was seen at [facility] that Dr. Carlie on 06/05/2019.  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  He was placed on ciprofloxacin and dexamethasone drops and recommended follow-up in the office.  The patient notes that he did not follow-up and has not sought care for this in the meantime.  He notes over the last week or so he has had drainage of the left ear, no significant pain, no dizziness, no ringing.  He notes persistently decreased hearing on the left.  He denies any history of recurrent ear infections as a child, no surgery of the head or neck.  He is a current smoker, no seasonal allergies.    Independent Review of Additional Tests or Records:    06/05/2019  Left ear: external ear normal, external auditory canal without substantial cerumen and with granuloma posteriorly medially with associated scant otorrhea, tympanic membrane with 25% posterior superior dry perforation, middle ear aerated   PMH/Meds/All/SocHx/FamHx/ROS:   Past Medical History:  Diagnosis Date   CKD stage 3b, GFR 30-44 ml/min (HCC)    Diabetes mellitus (HCC)    Hypertension    Obstructive uropathy      History reviewed. No pertinent surgical history.  History  reviewed. No pertinent family history.   Social Connections: Not on file      Current Outpatient Medications:    cephALEXin  (KEFLEX ) 500 MG capsule, Take 1 capsule (500 mg total) by mouth 2 (two) times daily. (Patient not taking: Reported on 10/31/2023), Disp: 14 capsule, Rfl: 0   finasteride  (PROSCAR ) 5 MG tablet, Take 1 tablet (5 mg total) by mouth daily., Disp: 30 tablet, Rfl: 3   NIFEdipine  (PROCARDIA -XL/NIFEDICAL-XL) 30 MG 24 hr tablet, Take 30 mg by mouth daily. (Patient not taking: Reported on 10/31/2023), Disp: , Rfl:    oxybutynin  (DITROPAN ) 5 MG tablet, Take 1 tablet (5 mg total) by mouth 3 (three) times daily as needed for bladder spasms. (Patient not taking: Reported on 10/31/2023), Disp: 20 tablet, Rfl: 0   polyethylene glycol (MIRALAX  / GLYCOLAX ) 17 g packet, Take 17 g by mouth daily as needed for mild constipation., Disp: 14 each, Rfl: 0   tamsulosin  (FLOMAX ) 0.4 MG CAPS capsule, Take 1 capsule (0.4 mg total) by mouth daily., Disp: 90 capsule, Rfl: 3   Physical Exam:   BP (!) 139/90   Pulse 92   SpO2 95%   Pertinent Findings  CN II-XII intact Right EAC clear, TM intact with well-pneumatized middle ear space, left canal with purulent material, TM with what appears to be a approximately 10% marginal posterior medial TM rupture with some purulence, no erythema Weber 512: Localizes left Rinne 512: Bone-conduction equal air conduction left, air conduction greater than bone  conduction right Anterior rhinoscopy: Septum left deviation; bilateral inferior turbinates with no hypertrophy No lesions of oral cavity/oropharynx; dentition numerous dental caries and missing teeth No obviously palpable neck masses/lymphadenopathy/thyromegaly No respiratory distress or stridor   Seprately Identifiable Procedures:  None  Impression & Plans:  William Bradshaw is a 68 y.o. male with the following   Ruptured left tympanic membrane-  68 year old male presents today with drainage from his left  ear with what appears to be a left ruptured membrane.  Had difficulty removing all of the debris from the canal as he did not tolerate this very well.  Chart review shows that he did have a similarly located rupture when he was seen approximately 5 years ago.  I would recommend ofloxacin  drops for the next 10 days, and like to see him back in the office at that time for repeat evaluation and better visualization.  I will order an audiogram at the next office visit for further evaluation.  The patient is happy with today's plan he will return as indicated or sooner as needed.  The patient verbalized understanding and agreement to today's plan had no further questions or concerns.   - f/u 10 days   Thank you for allowing me the opportunity to care for your patient. Please do not hesitate to contact me should you have any other questions.  Sincerely, [name] Cohen PA-C [facility] Phone: [phone] Fax: [phone]  02/05/2024, 1:52 PM

## 2024-02-18 ENCOUNTER — Ambulatory Visit (INDEPENDENT_AMBULATORY_CARE_PROVIDER_SITE_OTHER): Admitting: Physician Assistant

## 2024-02-19 ENCOUNTER — Inpatient Hospital Stay: Attending: Family Medicine

## 2024-03-04 ENCOUNTER — Other Ambulatory Visit

## 2024-03-04 ENCOUNTER — Inpatient Hospital Stay: Attending: Oncology | Admitting: Oncology

## 2024-04-02 ENCOUNTER — Ambulatory Visit (INDEPENDENT_AMBULATORY_CARE_PROVIDER_SITE_OTHER): Admitting: Physician Assistant

## 2024-04-08 ENCOUNTER — Encounter (INDEPENDENT_AMBULATORY_CARE_PROVIDER_SITE_OTHER): Payer: Self-pay | Admitting: Physician Assistant

## 2024-04-08 ENCOUNTER — Ambulatory Visit (INDEPENDENT_AMBULATORY_CARE_PROVIDER_SITE_OTHER): Admitting: Physician Assistant

## 2024-04-08 VITALS — BP 131/69 | HR 81 | Temp 97.0°F

## 2024-04-08 DIAGNOSIS — H9212 Otorrhea, left ear: Secondary | ICD-10-CM

## 2024-04-08 DIAGNOSIS — H7292 Unspecified perforation of tympanic membrane, left ear: Secondary | ICD-10-CM | POA: Diagnosis not present

## 2024-04-08 MED ORDER — AMOXICILLIN-POT CLAVULANATE 875-125 MG PO TABS
1.0000 | ORAL_TABLET | Freq: Two times a day (BID) | ORAL | 0 refills | Status: AC
Start: 2024-04-08 — End: 2024-04-22

## 2024-04-08 MED ORDER — CIPROFLOXACIN-DEXAMETHASONE 0.3-0.1 % OT SUSP: 4.0000 [drp] | Freq: Two times a day (BID) | OTIC | 0 refills | Status: AC

## 2024-04-08 NOTE — Patient Instructions (Signed)
 I have ordered an imaging study for you to complete prior to your next visit. Please call [facility] Scheduling at [phone] to schedule your imaging if you have not received a call within 24 hours. If you are unable to complete your imaging study prior to your next scheduled visit please call our office to let us  know.     Please schedule a follow up office visit with me two weeks after CT is complete.

## 2024-04-08 NOTE — Progress Notes (Signed)
 Dear Dr. Maree, Here is my assessment for our mutual patient, William Bradshaw. Thank you for allowing me the opportunity to care for your patient. Please do not hesitate to contact me should you have any other questions. Sincerely, [name] Cohen PA-C  Otolaryngology Clinic Note Referring provider: Dr. Maree HPI:  William Bradshaw is a 68 y.o. male kindly referred by Dr. Maree   The patient is a 68 year old gentleman seen in our office for evaluation of left tympanic membrane rupture.  He was last seen in the office on 02/05/2024.  Below is a recap of that encounter.  The patient is a 68 year old gentleman seen in our office for evaluation of left ear drainage.  The patient notes that for the last 5 years he has had decreased hearing out of his left ear, intermittent drainage from the left ear and an echoing sound in the ear.  He reports he was seen at [facility] that Dr. Carlie on 06/05/2019.  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  He was placed on ciprofloxacin and dexamethasone drops and recommended follow-up in the office.  The patient notes that he did not follow-up and has not sought care for this in the meantime.  He notes over the last week or so he has had drainage of the left ear, no significant pain, no dizziness, no ringing.  He notes persistently decreased hearing on the left.  He denies any history of recurrent ear infections as a child, no surgery of the head or neck.  He is a current smoker, no seasonal allergies.   Update 04/08/2024  Since his last office visit he did complete the Ciprodex drops.  He notes that the drainage did stop but then again returned approximately 1 week ago.  He notes a moderate ache to the left ear, some muffled hearing in the left ear that has been coming and going.  He notes some minimal drainage as well.  He is uncertain why he had a ruptured eardrum  originally approximately 5 years ago, no related trauma.    Independent Review of Additional Tests or Records:  None   PMH/Meds/All/SocHx/FamHx/ROS:   Past Medical History:  Diagnosis Date   CKD stage 3b, GFR 30-44 ml/min (HCC)    Diabetes mellitus (HCC)    Hypertension    Obstructive uropathy      History reviewed. No pertinent surgical history.  History reviewed. No pertinent family history.   Social Connections: Not on file      Current Outpatient Medications:    cephALEXin  (KEFLEX ) 500 MG capsule, Take 1 capsule (500 mg total) by mouth 2 (two) times daily. (Patient not taking: Reported on 10/31/2023), Disp: 14 capsule, Rfl: 0   finasteride  (PROSCAR ) 5 MG tablet, Take 1 tablet (5 mg total) by mouth daily., Disp: 30 tablet, Rfl: 3   NIFEdipine  (PROCARDIA -XL/NIFEDICAL-XL) 30 MG 24 hr tablet, Take 30 mg by mouth daily. (Patient not taking: Reported on 10/31/2023), Disp: , Rfl:    oxybutynin  (DITROPAN ) 5 MG tablet, Take 1 tablet (5 mg total) by mouth 3 (three) times daily as needed for bladder spasms. (Patient not taking: Reported on 10/31/2023), Disp: 20 tablet, Rfl: 0   polyethylene glycol (MIRALAX  / GLYCOLAX ) 17 g packet, Take 17 g by mouth daily as needed for mild constipation., Disp: 14 each, Rfl: 0   tamsulosin  (FLOMAX ) 0.4 MG CAPS capsule, Take 1 capsule (0.4 mg total)  by mouth daily., Disp: 90 capsule, Rfl: 3   Physical Exam:   BP 131/69 (BP Location: Right Arm)   Pulse 81   Temp (!) 97 F (36.1 C)   SpO2 95%   Pertinent Findings  CN II-XII intact Right EAC clear, TM intact with well-pneumatized middle ear space, left canal with purulent material, TM with what appears to be a approximately 10% marginal posterior medial TM rupture with some purulence, no erythema Anterior rhinoscopy: Septum left deviation; bilateral inferior turbinates with no hypertrophy No lesions of oral cavity/oropharynx; dentition numerous dental caries and missing teeth No obviously palpable neck  masses/lymphadenopathy/thyromegaly No respiratory distress or stridor  Seprately Identifiable Procedures:  None  Impression & Plans:  William Bradshaw is a 68 y.o. male with the following   Ruptured TM-  68 year old with a history of ruptured left tympanic membrane.  He was originally seen approximately 5 years ago he has had intermittent drainage since that time.  He noted improvement after the otic drops but did not follow-up with me shortly after there.  He had reoccurrence of the drainage.  At this point I would recommend oral antibiotics have placed him on Augmentin, Ciprodex drops and a CT of the temporal bones.  I want to assure no underlying lesions that were the source of the rupture.  Once he has completed the CT I would like to see him in the office for repeat evaluation and further management as he may be a candidate for tympanoplasty.  He will need an audiological evaluation once the drainage is under control.   - f/u 2 weeks after CT is complete   Thank you for allowing me the opportunity to care for your patient. Please do not hesitate to contact me should you have any other questions.  Sincerely, [name] Cohen PA-C [facility] Phone: [phone] Fax: [phone]  04/08/2024, 3:18 PM

## 2024-04-13 ENCOUNTER — Ambulatory Visit (HOSPITAL_COMMUNITY)
Admission: RE | Admit: 2024-04-13 | Discharge: 2024-04-13 | Disposition: A | Discharge status: Home / Self Care | Source: Ambulatory Visit | Attending: Physician Assistant | Admitting: Physician Assistant

## 2024-04-13 DIAGNOSIS — H7292 Unspecified perforation of tympanic membrane, left ear: Secondary | ICD-10-CM

## 2024-04-13 DIAGNOSIS — H7192 Unspecified cholesteatoma, left ear: Secondary | ICD-10-CM | POA: Diagnosis not present

## 2024-04-21 ENCOUNTER — Telehealth (INDEPENDENT_AMBULATORY_CARE_PROVIDER_SITE_OTHER): Payer: Self-pay | Admitting: Physician Assistant

## 2024-04-21 ENCOUNTER — Telehealth (INDEPENDENT_AMBULATORY_CARE_PROVIDER_SITE_OTHER): Payer: Self-pay

## 2024-04-21 NOTE — Telephone Encounter (Signed)
 Pt sister called stated the ear drops you sent in his insurance will not cover it asked if you could send something his insurance should cover.

## 2024-04-21 NOTE — Telephone Encounter (Signed)
 Attempted to call to discuss CT results and follow-up plan.  No voicemail box set up.  If the patient does call back I would recommend audiological evaluation in office visit with me.

## 2024-04-22 ENCOUNTER — Other Ambulatory Visit (INDEPENDENT_AMBULATORY_CARE_PROVIDER_SITE_OTHER): Payer: Self-pay | Admitting: Physician Assistant

## 2024-04-22 MED ORDER — OFLOXACIN 0.3 % OT SOLN: 5.0000 [drp] | Freq: Every day | OTIC | 0 refills | Status: AC

## 2024-04-22 NOTE — Telephone Encounter (Signed)
 I sent in a new antibiotic to the [facility], if they have any issues please have them call again.

## 2024-04-22 NOTE — Telephone Encounter (Signed)
 Called Pt was unable to leave a VM will try again later.

## 2024-05-08 ENCOUNTER — Telehealth (INDEPENDENT_AMBULATORY_CARE_PROVIDER_SITE_OTHER): Payer: Self-pay | Admitting: Physician Assistant

## 2024-05-08 ENCOUNTER — Ambulatory Visit (INDEPENDENT_AMBULATORY_CARE_PROVIDER_SITE_OTHER): Admitting: Physician Assistant

## 2024-05-08 VITALS — BP 125/81 | HR 74

## 2024-05-08 DIAGNOSIS — H7292 Unspecified perforation of tympanic membrane, left ear: Secondary | ICD-10-CM | POA: Diagnosis not present

## 2024-05-08 DIAGNOSIS — H748X2 Other specified disorders of left middle ear and mastoid: Secondary | ICD-10-CM

## 2024-05-08 DIAGNOSIS — H902 Conductive hearing loss, unspecified: Secondary | ICD-10-CM

## 2024-05-08 NOTE — Telephone Encounter (Signed)
 Called and left vm for patient's sister to call back and schedule a audiogram before seeing [name].

## 2024-05-08 NOTE — Progress Notes (Signed)
 Dear Dr. Maree, Here is my assessment for our mutual patient, William Bradshaw. Thank you for allowing me the opportunity to care for your patient. Please do not hesitate to contact me should you have any other questions. Sincerely, [name] Cohen PA-C  Otolaryngology Clinic Note Referring provider: Dr. Maree HPI:  William Bradshaw is a 68 y.o. male kindly referred by Dr. Maree   Discussed the use of AI scribe software for clinical note transcription with the patient, who gave verbal consent to proceed.  History of Present Illness     Patient is a 68 year old gentleman seen in our office for follow-up evaluation of left tympanic membrane rupture.  He was last seen in the office on 04/08/2024.  Below is recap of encounter.  The patient is a 68 year old gentleman seen in our office for evaluation of left tympanic membrane rupture.  He was last seen in the office on 02/05/2024.  Below is a recap of that encounter.   The patient is a 68 year old gentleman seen in our office for evaluation of left ear drainage.  The patient notes that for the last 5 years he has had decreased hearing out of his left ear, intermittent drainage from the left ear and an echoing sound in the ear.  He reports he was seen at [facility] that Dr. Carlie on 06/05/2019.  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  He was placed on ciprofloxacin and dexamethasone drops and recommended follow-up in the office.  The patient notes that he did not follow-up and has not sought care for this in the meantime.  He notes over the last week or so he has had drainage of the left ear, no significant pain, no dizziness, no ringing.  He notes persistently decreased hearing on the left.  He denies any history of recurrent ear infections as a child, no surgery of the head or neck.  He is a current smoker, no seasonal allergies.    Update  04/08/2024   Since his last office visit he did complete the Ciprodex drops.  He notes that the drainage did stop but then again returned approximately 1 week ago.  He notes a moderate ache to the left ear, some muffled hearing in the left ear that has been coming and going.  He notes some minimal drainage as well.   He is uncertain why he had a ruptured eardrum originally approximately 5 years ago, no related trauma.    Update 05/14/2024.  Since his last office visit he notes no further drainage from his ear.  He notes continued difficulty hearing out of the left ear.  He denies any infectious signs or symptoms.  He had a CT temporal bone completed on 04/13/2024.  He has not completed audiological evaluation recently.   Independent Review of Additional Tests or Records:  CT temporal bone 04/13/2024   MPRESSION: 1. Normal right-sided 2. There is thickening of the left tympanic membrane with small amount of tissue in Prussak's space without widening of Prussak's space. There is abnormal tissue in the hypotympanum and within the oval window. The ossicular chain is intact and there are no bone erosions. The mastoids are opacified. 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  No family history on file.   Social Connections: Not on file      Current Outpatient Medications:    ciprofloxacin-dexamethasone (CIPRODEX) OTIC suspension, Place 4 drops into the left ear 2 (two) times daily., Disp: 7.5 mL, Rfl: 0   finasteride  (PROSCAR ) 5 MG tablet, Take 1 tablet (5 mg total) by mouth daily., Disp: 30 tablet, Rfl: 3   polyethylene glycol (MIRALAX  / GLYCOLAX ) 17 g packet, Take 17 g by mouth daily as needed for mild constipation., Disp: 14 each, Rfl: 0   tamsulosin  (FLOMAX ) 0.4 MG CAPS  capsule, Take 1 capsule (0.4 mg total) by mouth daily., Disp: 90 capsule, Rfl: 3   cephALEXin  (KEFLEX ) 500 MG capsule, Take 1 capsule (500 mg total) by mouth 2 (two) times daily. (Patient not taking: Reported on 05/08/2024), Disp: 14 capsule, Rfl: 0   NIFEdipine  (PROCARDIA -XL/NIFEDICAL-XL) 30 MG 24 hr tablet, Take 30 mg by mouth daily. (Patient not taking: Reported on 05/08/2024), Disp: , Rfl:    oxybutynin  (DITROPAN ) 5 MG tablet, Take 1 tablet (5 mg total) by mouth 3 (three) times daily as needed for bladder spasms. (Patient not taking: Reported on 05/08/2024), Disp: 20 tablet, Rfl: 0   Physical Exam:   BP 125/81 (BP Location: Right Arm, Patient Position: Sitting)   Pulse 74   SpO2 97%   Pertinent Findings  CN II-XII intact Right EAC clear, TM intact with well-pneumatized middle ear space,TM with what appears to be a approximately 10% marginal posterior medial TM rupture with no purulence, no erythema, flesh-colored tissue noted in the middle ear Anterior rhinoscopy: Septum left deviation; bilateral inferior turbinates with no hypertrophy No lesions of oral cavity/oropharynx; dentition numerous dental caries and missing teeth No obviously palpable neck masses/lymphadenopathy/thyromegaly No respiratory distress or stridor       Seprately Identifiable Procedures:  None  Impression & Plans:  William Bradshaw is a 68 y.o. 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 No infection. Possible surgical intervention needed. - Ordered hearing test. - Once audiological evaluation is complete we will discuss this with one of the surgeon for further management.      - f/u phone call discussion after audiological evaluation   Thank you for allowing me the opportunity to care for your patient. Please do not hesitate to contact me  should you have any other questions.  Sincerely, [name] Cohen PA-C [facility] Phone: [phone] Fax: [phone]  05/08/2024, 4:14 PM

## 2024-05-15 ENCOUNTER — Ambulatory Visit (INDEPENDENT_AMBULATORY_CARE_PROVIDER_SITE_OTHER): Admitting: Audiology

## 2024-05-15 DIAGNOSIS — H90A12 Conductive hearing loss, unilateral, left ear with restricted hearing on the contralateral side: Secondary | ICD-10-CM

## 2024-05-15 NOTE — Progress Notes (Signed)
  [address], [address] [place], [place] [address] [phone]  Audiological Evaluation    Name: William Bradshaw     DOB:   04-10-1956      MRN:   [id number]                                                                                     Service Date: 05/15/2024     Accompanied by: unaccompanied   Patient comes today after Reyes Cohen, PA-C sent a referral for a hearing evaluation due to concerns with left tympanic membrane perforation.   Symptoms Yes Details  Hearing loss  [x]  Left hearing is muffled  Tinnitus  []    Ear pain/ infections/pressure  []    Balance problems  []    Noise exposure history  [x]  occupational  Previous ear surgeries  []    Family history of hearing loss  []    Amplification  []    Other  []      Otoscopy: Right ear: Clear external ear canal and notable landmarks visualized on the tympanic membrane. Left ear:  Abnormal eardrum appearance.  Tympanometry: Right ear: Normal external ear canal volume with normal middle ear pressure and tympanic membrane compliance (Type A). Findings are suggestive of normal middle ear function. Left ear: Large external ear canal volume with no middle ear pressure peak or tympanic membrane compliance (Type B). Findings are suggestive of the presence of a tympanic membrane perforation or a pressure equalization (PE) tube.  Hearing Evaluation The hearing test results were completed under headphones and results are deemed to be of good reliability. Test technique:  conventional    Pure tone Audiometry: Right ear- Normal hearing from [id number] Hz, the mild to moderate presumably sensorineural hearing loss from 6000 Hz - 8000 Hz. Left ear-  Mild to severe essentially conductive hearing loss from 125 Hz - 8000 Hz.  Speech Audiometry: Right ear- Speech Reception Threshold (SRT) was obtained at 5 dBHL. Left ear-Speech Reception Threshold (SRT) was obtained at 40 dBHL, with contralateral masking.   Word Recognition  Score Tested using NU-6 (recorded) Right ear: 100% was obtained at a presentation level of 55 dBHL with contralateral masking which is deemed as  excellent. Left ear: 100% was obtained at a presentation level of 80 dBHL with contralateral masking which is deemed as  excellent.     Recommendations: Follow up with ENT as scheduled for today. Repeat audiogram after medical care.   Garielle Mroz MARIE LEROUX-MARTINEZ, AUD

## 2024-05-18 ENCOUNTER — Telehealth (INDEPENDENT_AMBULATORY_CARE_PROVIDER_SITE_OTHER): Payer: Self-pay | Admitting: Physician Assistant

## 2024-05-18 ENCOUNTER — Telehealth (INDEPENDENT_AMBULATORY_CARE_PROVIDER_SITE_OTHER): Payer: Self-pay | Admitting: Otolaryngology

## 2024-05-18 NOTE — Telephone Encounter (Signed)
 Called to discuss audio results with patient. No answer, left voicemail with his sister to call back.

## 2024-05-18 NOTE — Telephone Encounter (Signed)
 Tried to call patient to schedule appointment with Dr. Tobie to discuss surgery.  # not in service.

## 2024-06-03 ENCOUNTER — Encounter: Payer: Self-pay | Admitting: Audiology

## 2024-06-11 ENCOUNTER — Telehealth (INDEPENDENT_AMBULATORY_CARE_PROVIDER_SITE_OTHER): Payer: Self-pay | Admitting: Physician Assistant

## 2024-06-11 NOTE — Telephone Encounter (Signed)
 Called patient sister to schedule patient an appointment with Dr.Patel to discuss Surgery per [place].

## 2024-08-18 ENCOUNTER — Ambulatory Visit (INDEPENDENT_AMBULATORY_CARE_PROVIDER_SITE_OTHER): Admitting: Otolaryngology
